# Patient Record
Sex: Female | Born: 1985 | Race: Black or African American | Hispanic: No | Marital: Single | Smoking: Current every day smoker
Health system: Southern US, Community
[De-identification: ages and names within clinical notes are randomized; demographics above are authoritative.]

## PROBLEM LIST (undated history)

## (undated) DIAGNOSIS — K219 Gastro-esophageal reflux disease without esophagitis: Secondary | ICD-10-CM

## (undated) DIAGNOSIS — K5900 Constipation, unspecified: Secondary | ICD-10-CM

## (undated) DIAGNOSIS — Z6841 Body Mass Index (BMI) 40.0 and over, adult: Secondary | ICD-10-CM

## (undated) HISTORY — DX: Constipation, unspecified: K59.00

## (undated) HISTORY — DX: Morbid (severe) obesity due to excess calories: E66.01

## (undated) HISTORY — DX: Body Mass Index (BMI) 40.0 and over, adult: Z684

## (undated) HISTORY — DX: Gastro-esophageal reflux disease without esophagitis: K21.9

---

## 2008-06-17 HISTORY — PX: ECTOPIC PREGNANCY SURGERY: SHX613

## 2008-11-12 ENCOUNTER — Emergency Department: Payer: Self-pay | Admitting: Unknown Physician Specialty

## 2008-12-13 ENCOUNTER — Emergency Department: Payer: Self-pay | Admitting: Emergency Medicine

## 2016-11-06 ENCOUNTER — Ambulatory Visit: Payer: Medicaid Other | Admitting: Physical Therapy

## 2018-02-25 ENCOUNTER — Other Ambulatory Visit: Payer: Self-pay

## 2018-02-25 ENCOUNTER — Ambulatory Visit: Payer: Medicaid Other | Attending: Internal Medicine

## 2018-02-25 DIAGNOSIS — G8929 Other chronic pain: Secondary | ICD-10-CM | POA: Diagnosis present

## 2018-02-25 DIAGNOSIS — M5441 Lumbago with sciatica, right side: Secondary | ICD-10-CM | POA: Insufficient documentation

## 2018-02-25 NOTE — Patient Instructions (Signed)
Access Code: [email or web address]  URL: [email or web address]  Date: 02/25/2018  Prepared by: Olga Coaster   Exercises  Prone Press Up - 10 reps - 2 sets - 2x daily - 7x weekly  Supine Lower Trunk Rotation - 10 reps - 1 sets - 5 hold - 2x daily - 7x weekly  Seated Piriformis Stretch with Trunk Bend - 3 reps - 1 sets - 20 hold - 2x daily - 7x weekly  Supine Piriformis Stretch with Foot on Ground - 3 reps - 1 sets - 20 hold - 2x daily - 7x weekly  Standing Lumbar Extension - 10 reps - 2 sets - 2x daily - 7x weekly

## 2018-02-25 NOTE — Therapy (Deleted)
[facility] [address], [place], [address] Phone: [phone]   Fax:  [phone]  Physical Therapy Evaluation  Patient Details  Name: Marcelyn Ruppe MRN: [id number] Date of Birth: 11-30-1985 Referring Provider: Alvina Filbert   Encounter Date: 02/25/2018  PT End of Session - 02/25/18 1301    Visit Number  1    Authorization Type  Medicaid     PT Start Time  1300    PT Stop Time  1400    PT Time Calculation (min)  60 min    Activity Tolerance  Patient tolerated treatment well    Behavior During Therapy  [facility] for tasks assessed/performed       History reviewed. No pertinent past medical history.  History reviewed. No pertinent surgical history.  There were no vitals filed for this visit.   Subjective Assessment - 02/25/18 1315    Subjective  Patient reports that she is not in any pain at start of session.     Pertinent History  Patient is 32 yo female that complains of low back pain that has been present for at least 6-7 months. It has worsened recently because it now radiates down the leg (in the last month). Patient reports that her pain increases with bending over, mobility, lifting, childcare. Patient had a two C sections for child birth (8 yrs ago, 2 yrs ago).  No other significant medical history.     Limitations  Sitting;Walking;Standing;House hold activities;Lifting    How long can you sit comfortably?  5 mins    How long can you stand comfortably?  5 mins    How long can you walk comfortably?  10 mins    Patient Stated Goals  pain relief; pain management    Currently in Pain?  No/denies    Pain Score  --   worst: 10/10 best: 0   Pain Location  Back    Pain Orientation  Mid;Lower    Pain Descriptors / Indicators  Sore;Burning;Tightness    Pain Type  Acute pain    Pain Radiating Towards  radiating to RLE (to knee)    Pain Onset  More than a month ago    Pain Frequency  Intermittent    Aggravating Factors   bending, sitting, mobility    Pain Relieving Factors  rest, hot water    Effect of Pain on Daily Activities  impedes ability to perform daily activities; worsening pain with functional tasks           Objective measurements completed on examination: See above findings.    Therapeutic exercise: Patient performed with instruction, verbal cues, tactile cues of therapist: goal: improve mobility, increase tissue extensibility  LTR with verbal/tactile cues 10x5"sec holds Piriformis stretch b/l in supine knee to chest 2x20"se ea Prone press up with cues for pacing/technique x10 Standing lumbar extension with table block x10 Seated piriformis stretch 2x20"sec b/l   Patient response to treatment: patient demonstrated improved technique with exercises with repeated demonstration and VC.     PT Education - 02/25/18 1318    Education Details  condition, PT role, expectations, therex    Person(s) Educated  Patient    Methods  Explanation;Demonstration;Handout    Comprehension  Verbalized understanding       PT Short Term Goals - 02/25/18 1520      PT SHORT TERM GOAL #1   Title  Patient will be adherent to HEP at least 3x a week to improve  functional abilities and in prep for self management of condition at home.    Time  3    Period  Weeks    Status  New    Target Date  03/18/18        PT Long Term Goals - 02/25/18 1521      PT LONG TERM GOAL #1   Title  Patient will increase BLE gross strength to 4+/5 as to improve functional strength for independent gait, increased standing tolerance and increased ADL ability.    Time  6    Period  Weeks    Status  New    Target Date  04/08/18      PT LONG TERM GOAL #2   Title  Patient will be able to perform household work/ chores without increase in symptoms.    Time  6    Period  Weeks    Status  New    Target Date  04/08/18      PT LONG TERM GOAL #3   Title  Patient will tolerate sitting unsupported  demonstrating erect sitting posture with minimal thoracic kyphosis for 15+ minutes with maximum of 5/10 back pain to demonstrate improved back extensor strength and improved sitting tolerance.     Time  6    Period  Weeks    Status  New    Target Date  04/08/18      PT LONG TERM GOAL #4   Title  Patient will demonstrate an improved ability to perform/tolerate functional tasks as exhibited by at least 10 point improvement in FOTO score.     Baseline  02/25/2018: 62    Time  6    Period  Weeks    Status  New    Target Date  04/08/18             Plan - 02/25/18 1345    Clinical Impression Statement  Patient is 32 yo female that presents with LBP that started around March 2019. States that it has worsened over time and she now gets shooting pain down her RLE to above the knee. Upon assessment patient demonstrates minimal deficits in lumbar and hip ROM, hypomobility of lumbar spine, decreased hip and core strength, impaired soft tissue integrity as well as postural abnormalities. These deficits limit the patients ability to perform functional activities such as lifting, twisting, sitting, walking, household activities, etc. The patient would benefit from physical therapy intervention to address these limitations and return patient to PLOF.    History and Personal Factors relevant to plan of care:  2 C sections, decreased physical activitiy    Clinical Presentation  Evolving    Clinical Presentation due to:  symptoms now radiating to RLE    Clinical Decision Making  Low    Rehab Potential  Good    Clinical Impairments Affecting Rehab Potential  decreased activity tolerance, impaired ROM, strength, postural abnormalities    PT Frequency  2x / week    PT Duration  6 weeks    PT Treatment/Interventions  ADLs/Self Care Home Management;Balance training;Ultrasound;Neuromuscular re-education;Aquatic Therapy;Spinal Manipulations;Joint Manipulations;Patient/family education;Gait  training;Cryotherapy;Stair training;Dry needling;Functional mobility training;Electrical Stimulation;Moist Heat;Therapeutic exercise;Therapeutic activities;Manual techniques;Taping    PT Next Visit Plan  body mechanics, STM, stretches, hip/core strengthening    PT Home Exercise Plan  LTR, piriformis, prone press up [email or web address]     Consulted and Agree with Plan of Care  Patient       Patient will benefit from skilled therapeutic intervention in order to  improve the following deficits and impairments:  Decreased endurance, Decreased mobility, Difficulty walking, Hypomobility, Increased muscle spasms, Improper body mechanics, Impaired tone, Decreased range of motion, Decreased activity tolerance, Decreased strength, Increased fascial restricitons, Impaired flexibility, Pain, Postural dysfunction  Visit Diagnosis: Chronic midline low back pain with right-sided sciatica - Plan: PT plan of care cert/re-cert     Problem List There are no active problems to display for this patient.  Olga Coaster PT, DPT 8:19 AM,02/26/18 [phone]  [facility] PH[facility] AND SPORTS MEDICINE [address] [place], [address] Phone: [phone]   Fax:  [phone]  Name: Dujuana Train MRN: [id number] Date of Birth: July 07, 1985

## 2018-02-26 NOTE — Therapy (Signed)
[facility] [address] [place], [address] Phone: [phone]   Fax:  [phone]  Physical Therapy Evaluation  Patient Details  Name: Joy Hoffman MRN: [id number] Date of Birth: 24-Feb-1986 Referring Provider: Alvina Filbert   Encounter Date: 02/25/2018  PT End of Session - 02/25/18 1301    Visit Number  1    Authorization Type  Medicaid     PT Start Time  1300    PT Stop Time  1400    PT Time Calculation (min)  60 min    Activity Tolerance  Patient tolerated treatment well    Behavior During Therapy  [facility] for tasks assessed/performed       History reviewed. No pertinent past medical history.  History reviewed. No pertinent surgical history.  There were no vitals filed for this visit.   Subjective Assessment - 02/25/18 1315    Subjective  Patient reports that she is not in any pain at start of session.     Pertinent History  Patient is 32 yo female that complains of low back pain that has been present for at least 6-7 months. It has worsened recently because it now radiates down the leg (in the last month). Patient reports that her pain increases with bending over, mobility, lifting, childcare. Patient had a two C sections for child birth (8 yrs ago, 2 yrs ago).  No other significant medical history.     Limitations  Sitting;Walking;Standing;House hold activities;Lifting    How long can you sit comfortably?  5 mins    How long can you stand comfortably?  5 mins    How long can you walk comfortably?  10 mins    Patient Stated Goals  pain relief; pain management    Currently in Pain?  No/denies    Pain Score  --   worst: 10/10 best: 0   Pain Location  Back    Pain Orientation  Mid;Lower    Pain Descriptors / Indicators  Sore;Burning;Tightness    Pain Type  Acute pain    Pain Radiating Towards  radiating to RLE (to knee)    Pain Onset  More than a month ago    Pain Frequency  Intermittent     Aggravating Factors   bending, sitting, mobility    Pain Relieving Factors  rest, hot water    Effect of Pain on Daily Activities  impedes ability to perform daily activities; worsening pain with functional tasks        02/25/18 0001  Assessment  Medical Diagnosis Radiculopathy  Referring Provider Alvina Filbert  Onset Date/Surgical Date 08/15/17  Hand Dominance Left  Next MD Visit 03/09/18  Prior Therapy no  Precautions  Precautions None  Restrictions  Weight Bearing Restrictions No  Balance Screen  Has the patient fallen in the past 6 months No  Has the patient had a decrease in activity level because of a fear of falling?  No  Is the patient reluctant to leave their home because of a fear of falling?  No  Home Environment  Living Environment Private residence  Living Arrangements Children;Spouse/significant other  Available Help at Discharge Family  Type of Home Apartment  Home Access Stairs to enter  Entrance Stairs-Number of Steps 2  Entrance Stairs-Rails None  Home Layout One level  Prior Function  Level of Independence Independent  Vocation Part time employment  Vocation Requirements [employer] (standing; awkward positions, lifting, reaching)  Cognition  Overall Cognitive  Status Within Functional Limits for tasks assessed  Posture/Postural Control  Posture Comments fwd head/rounded shoulder/slouched in sitting  ROM / Strength  AROM / PROM / Strength AROM;Strength  AROM  Overall AROM  Deficits  AROM Assessment Site Lumbar;Hip  Right/Left Hip Right;Left  Lumbar Flexion 100 (painful)  Lumbar Extension 100 (hinged in lumbar spine)  Lumbar - Right Side Bend 100  Lumbar - Left Side Bend 100  Lumbar - Right Rotation 100  Lumbar - Left Rotation 100  Right Hip Flexion 90  Right Hip Internal Rotation  20  Left Hip Flexion 110  Left Hip External Rotation  30  Left Hip Internal Rotation  30  Strength  Overall Strength Deficits  Overall Strength Comments  decreased core strength  Strength Assessment Site Hip  Right/Left Hip Right;Left  Right Hip Flexion 4+/5  Right Hip Extension 4-/5  Right Hip ABduction 3+/5  Left Hip Flexion 4+/5  Left Hip Extension 4-/5  Left Hip ABduction 3+/5  Flexibility  Soft Tissue Assessment /Muscle Length y  Hamstrings mild b/l  Quadriceps mild b/l  Piriformis mod R mild L  Palpation  Palpation comment tender to lumbar spine/paraspinals, hypertonic hip muscles/paraspinals  Special Tests  Other special tests  (FADDIr, FABER, SLR, slump, scour -)    Objective measurements completed on examination: See above findings.   Therapeutic exercise: Patient performed with instruction, verbal cues, tactile cues of therapist: goal: improve mobility, increase tissue extensibility  LTR with verbal/tactile cues 10x5"sec holds Piriformis stretch b/l in supine knee to chest 2x20"se ea Prone press up with cues for pacing/technique x10 Standing lumbar extension with table block x10 Seated piriformis stretch 2x20"sec b/l   Patient response to treatment: patient demonstrated improved technique with exercises with repeated demonstration and VC.    PT Education - 02/25/18 1318    Education Details  condition, PT role, expectations, therex    Person(s) Educated  Patient    Methods  Explanation;Demonstration;Handout    Comprehension  Verbalized understanding       PT Short Term Goals - 02/25/18 1520      PT SHORT TERM GOAL #1   Title  Patient will be adherent to HEP at least 3x a week to improve functional abilities and in prep for self management of condition at home.    Baseline  Patient needs further instruction, cues, and encouragement to perform HEP properly.    Time  3    Period  Weeks    Status  New    Target Date  03/18/18        PT Long Term Goals - 02/25/18 1521      PT LONG TERM GOAL #1   Title  Patient will increase BLE gross strength to 4+/5 as to improve functional strength for independent  gait, increased standing tolerance and increased ADL ability.    Baseline  See objective measures section for current MMT strength of LE.    Time  6    Period  Weeks    Status  New    Target Date  04/08/18      PT LONG TERM GOAL #2   Title  Patient will be able to perform household work/ chores without increase in symptoms.    Baseline  Patient with pain 0-10 with activities.    Time  6    Period  Weeks    Status  New    Target Date  04/08/18      PT LONG TERM GOAL #3  Title  Patient will tolerate sitting unsupported demonstrating erect sitting posture with minimal thoracic kyphosis for 15+ minutes with maximum of 5/10 back pain to demonstrate improved back extensor strength and improved sitting tolerance.     Baseline  Patient only able to tolerate erect sitting ~675mins before pain becomes intolerable    Time  6    Period  Weeks    Status  New    Target Date  04/08/18      PT LONG TERM GOAL #4   Title  Patient will demonstrate an improved ability to perform/tolerate functional tasks as exhibited by at least 10 point improvement in FOTO score.     Baseline  02/25/2018: 62    Time  6    Period  Weeks    Status  New    Target Date  04/08/18             Plan - 02/25/18 1345    Clinical Impression Statement  Patient is 32 yo female that presents with LBP that started around March 2019. States that it has worsened over time and she now gets shooting pain down her RLE to above the knee. Upon assessment patient demonstrates minimal deficits in lumbar and hip ROM, hypomobility of lumbar spine, decreased hip and core strength, impaired soft tissue integrity as well as postural abnormalities. These deficits limit the patients ability to perform functional activities such as lifting, twisting, sitting, walking, household activities, etc. The patient would benefit from physical therapy intervention to address these limitations and return patient to PLOF.    History and Personal Factors  relevant to plan of care:  2 C sections, decreased physical activitiy    Clinical Presentation  Evolving    Clinical Presentation due to:  symptoms now radiating to RLE    Clinical Decision Making  Low    Rehab Potential  Good    Clinical Impairments Affecting Rehab Potential  decreased activity tolerance, impaired ROM, strength, postural abnormalities    PT Frequency  2x / week    PT Duration  6 weeks    PT Treatment/Interventions  ADLs/Self Care Home Management;Balance training;Ultrasound;Neuromuscular re-education;Aquatic Therapy;Spinal Manipulations;Joint Manipulations;Patient/family education;Gait training;Cryotherapy;Stair training;Dry needling;Functional mobility training;Electrical Stimulation;Moist Heat;Therapeutic exercise;Therapeutic activities;Manual techniques;Taping    PT Next Visit Plan  body mechanics, STM, stretches, hip/core strengthening    PT Home Exercise Plan  LTR, piriformis, prone press up [email or web address]     Consulted and Agree with Plan of Care  Patient       Patient will benefit from skilled therapeutic intervention in order to improve the following deficits and impairments:  Decreased endurance, Decreased mobility, Difficulty walking, Hypomobility, Increased muscle spasms, Improper body mechanics, Impaired tone, Decreased range of motion, Decreased activity tolerance, Decreased strength, Increased fascial restricitons, Impaired flexibility, Pain, Postural dysfunction  Visit Diagnosis: Chronic midline low back pain with right-sided sciatica - Plan: PT plan of care cert/re-cert     Problem List There are no active problems to display for this patient.  Olga CoasterDiana Alivea Gladson PT, DPT 9:40 AM,02/26/18 [phone]  [facility] [address] [place], [place], [address] Phone: [phone]   Fax:  [phone]  Name: Joy Hoffman MRN: [id number] Date of Birth: Jul 29, 1985

## 2018-03-04 ENCOUNTER — Ambulatory Visit: Payer: Medicaid Other

## 2018-03-05 ENCOUNTER — Ambulatory Visit: Payer: Medicaid Other

## 2018-03-11 ENCOUNTER — Ambulatory Visit: Payer: Medicaid Other

## 2018-03-11 DIAGNOSIS — G8929 Other chronic pain: Secondary | ICD-10-CM

## 2018-03-11 DIAGNOSIS — M5441 Lumbago with sciatica, right side: Principal | ICD-10-CM

## 2018-03-11 NOTE — Therapy (Signed)
[facility] [address] [place], [address] Phone: [phone]   Fax:  [phone]  Physical Therapy Treatment  Patient Details  Name: Joy Hoffman MRN: [id number] Date of Birth: [birth year] Referring Provider: Alvina Filbert   Encounter Date: 03/11/2018  PT End of Session - 03/11/18 1733    Visit Number  2    Authorization Type  Medicaid     Authorization Time Period  1/3    PT Start Time  1731    PT Stop Time  1815    PT Time Calculation (min)  44 min    Activity Tolerance  Patient tolerated treatment well    Behavior During Therapy  [facility] for tasks assessed/performed       History reviewed. No pertinent past medical history.  History reviewed. No pertinent surgical history.  There were no vitals filed for this visit.  Subjective Assessment - 03/11/18 1732    Subjective  Patient reports that she still has some burning/tightness in midline of low back. Patient reports that she was standing all day.     Currently in Pain?  Yes    Pain Score  2     Pain Location  Back    Pain Onset  More than a month ago       Objective: hypomobility of lumbar spine assessed with CPA mobilizations  Therapeutic exercise:Patient performed with instruction, verbal cues, tactile cues of therapist: goal: improve mobility, strengthen LE, increase tissue extensibility  LTR with verbal/tactile cues 10x5"sec holds Piriformis stretch b/l in supine knee to chest 2x20"se ea Prone press up with cues for pacing/technique x10  Seated piriformis stretch 3x30"sec b/l  Prone hip extension with verbal cues x10 Clamshells in s/l with tactile/verbal cues x15 b/l Seated HS stretch b/l 3x30s Mini crunches 2x10 Oblique work 2x10 (tapping each ankle in supine by performing thoracic lat flexion) TrA activation in supine with verbal/visual cues 10x3s holds   Manual therapy: x55mins CPA to lumbar spine 30s bouts x2 bouts per  level IASTM to b/l lumbar paraspinals for tissue tension  Patient response to treatment: patient demonstrated improved technique with exercises with repeated demonstration and VC.     PT Education - 03/11/18 1733    Education Details  therex technique    Person(s) Educated  Patient    Methods  Explanation;Tactile cues;Verbal cues    Comprehension  Verbalized understanding;Returned demonstration       PT Short Term Goals - 02/25/18 1520      PT SHORT TERM GOAL #1   Title  Patient will be adherent to HEP at least 3x a week to improve functional abilities and in prep for self management of condition at home.    Baseline  Patient needs further instruction, cues, and encouragement to perform HEP properly.    Time  3    Period  Weeks    Status  New    Target Date  03/18/18        PT Long Term Goals - 02/25/18 1521      PT LONG TERM GOAL #1   Title  Patient will increase BLE gross strength to 4+/5 as to improve functional strength for independent gait, increased standing tolerance and increased ADL ability.    Baseline  See objective measures section for current MMT strength of LE.    Time  6    Period  Weeks    Status  New    Target Date  04/08/18  PT LONG TERM GOAL #2   Title  Patient will be able to perform household work/ chores without increase in symptoms.    Baseline  Patient with pain 0-10 with activities.    Time  6    Period  Weeks    Status  New    Target Date  04/08/18      PT LONG TERM GOAL #3   Title  Patient will tolerate sitting unsupported demonstrating erect sitting posture with minimal thoracic kyphosis for 15+ minutes with maximum of 5/10 back pain to demonstrate improved back extensor strength and improved sitting tolerance.     Baseline  Patient only able to tolerate erect sitting ~27mins before pain becomes intolerable    Time  6    Period  Weeks    Status  New    Target Date  04/08/18      PT LONG TERM GOAL #4   Title  Patient will  demonstrate an improved ability to perform/tolerate functional tasks as exhibited by at least 10 point improvement in FOTO score.     Baseline  02/25/2018: 62    Time  6    Period  Weeks    Status  New    Target Date  04/08/18            Plan - 03/11/18 1732    Clinical Impression Statement  Patient demonstrated good tolerance to activity today. Reported improvement in symptoms with lumbar spine CPAs grade III/IV, improved spinal mobility with repeated mobilizations.    PT Treatment/Interventions  ADLs/Self Care Home Management;Balance training;Ultrasound;Neuromuscular re-education;Aquatic Therapy;Spinal Manipulations;Joint Manipulations;Patient/family education;Gait training;Cryotherapy;Stair training;Dry needling;Functional mobility training;Electrical Stimulation;Moist Heat;Therapeutic exercise;Therapeutic activities;Manual techniques;Taping    PT Next Visit Plan  body mechanics, STM, stretches, hip/core strengthening    PT Home Exercise Plan  LTR, piriformis, prone press up [email or web address]        Patient will benefit from skilled therapeutic intervention in order to improve the following deficits and impairments:  Decreased endurance, Decreased mobility, Difficulty walking, Hypomobility, Increased muscle spasms, Improper body mechanics, Impaired tone, Decreased range of motion, Decreased activity tolerance, Decreased strength, Increased fascial restricitons, Impaired flexibility, Pain, Postural dysfunction  Visit Diagnosis: Chronic midline low back pain with right-sided sciatica     Problem List There are no active problems to display for this patient.  Olga Coaster PT, DPT 6:16 PM,03/11/18 [phone]   [facility] PHYSICAL AND SPORTS MEDICINE [address] [place], [address] Phone: [phone]   Fax:  [phone]  Name: Brinlynn Gorton MRN: [id number] Date of Birth: Feb 27, 1986

## 2018-03-24 ENCOUNTER — Ambulatory Visit: Payer: Medicaid Other | Attending: Internal Medicine

## 2018-03-24 DIAGNOSIS — G8929 Other chronic pain: Secondary | ICD-10-CM | POA: Insufficient documentation

## 2018-03-24 DIAGNOSIS — M5441 Lumbago with sciatica, right side: Secondary | ICD-10-CM | POA: Insufficient documentation

## 2018-03-24 NOTE — Therapy (Signed)
[facility]INE [address], [place], [address] Phone: [phone]   Fax:  [phone]  Physical Therapy Treatment  Patient Details  Name: Joy Hoffman MRN: [id number] Date of Birth: 12/13/1985 Referring Provider (PT): Alvina Filbert   Encounter Date: 03/24/2018  PT End of Session - 03/24/18 1613    Visit Number  3    Authorization Type  Medicaid     Authorization Time Period  2/3    PT Start Time  1614    PT Stop Time  1707    PT Time Calculation (min)  53 min    Activity Tolerance  Patient tolerated treatment well    Behavior During Therapy  [facility] for tasks assessed/performed       History reviewed. No pertinent past medical history.  History reviewed. No pertinent surgical history.  There were no vitals filed for this visit.  Subjective Assessment - 03/24/18 1618    Subjective  Patient reports a little bit of pain at start of session, 3/10. Patient reports that it took her about 45 minutes to get here and her pain increased with the drive. Patient reports a sharp, cramping pain that lasted about 10 mins, states it has not happened since.     Pertinent History  Patient is 32 yo female that complains of low back pain that has been present for at least 6-7 months. It has worsened recently because it now radiates down the leg (in the last month). Patient reports that her pain increases with bending over, mobility, lifting, childcare. Patient had a two C sections for child birth (8 yrs ago, 2 yrs ago).  No other significant medical history.     How long can you sit comfortably?  3 hours    How long can you stand comfortably?  1-2hrs    How long can you walk comfortably?  3-4 hrs     Patient Stated Goals  pain relief; pain management    Currently in Pain?  Yes    Pain Score  3     Pain Location  Back    Pain Orientation  Mid    Pain Descriptors / Indicators  Sore    Pain Type  Acute pain    Pain Radiating  Towards  no longer radiating    Pain Onset  More than a month ago    Aggravating Factors   bending for prolonged periods    Pain Relieving Factors  movement, exercises, hot water         [facility] PT Assessment - 03/24/18 0001      AROM   Lumbar Flexion  100    Lumbar Extension  100    Lumbar - Right Side Bend  100    Lumbar - Left Side Bend  100    Lumbar - Right Rotation  100    Lumbar - Left Rotation  100      Strength   Right Hip Flexion  4+/5    Right Hip Extension  4/5    Right Hip ABduction  4/5    Left Hip Flexion  4+/5    Left Hip Extension  4/5    Left Hip ABduction  3+/5      Flexibility   Hamstrings  mild b/l    Piriformis  mild b/l      Palpation   Palpation comment  not TTP          See objective measures above.  Therapeutic exercise:Patient performed with instruction, verbal cues, tactile cues of therapist: goal: improve mobility, strengthen LE, increase tissue extensibility  LTR with verbal/tactile cues 10x5"sec holds Piriformis stretch b/l in supine knee to chest 1x30"se ea Prone press up with cues for pacing/technique x10 Seated piriformis stretch 3x30"sec b/l  Seated HS stretch x30s ea, verbal/demonstration needed Prone hip extension with verbal cues x10 Clamshells in s/l with tactile/verbal cues x15 b/l Bridges x15 with transverse abdominis activation and verbal cues Seated HS stretch b/l 3x30s Mini crunches 2x10 TrA activation in supine with verbal/visual cues 10x3s holds Sit to stands with proper form and TrA activation. Verbal and tactile cues needed, as well as demonstrated    Patient response to treatment:patient demonstrated improved technique with exercises with repetitions and VC.     PT Education - 03/24/18 1622    Education Details  therex technique    Person(s) Educated  Patient    Methods  Explanation;Demonstration;Tactile cues    Comprehension  Verbalized understanding;Returned demonstration       PT Short Term  Goals - 03/24/18 1622      PT SHORT TERM GOAL #1   Title  Patient will be adherent to HEP at least 3x a week to improve functional abilities and in prep for self management of condition at home.    Baseline  Patient reports consistently performing her exercises    Status  Achieved    Target Date  03/24/18        PT Long Term Goals - 03/24/18 1622      PT LONG TERM GOAL #1   Title  Patient will increase BLE gross strength to 4+/5 as to improve functional strength for independent gait, increased standing tolerance and increased ADL ability.    Baseline  See objective measures section for current MMT strength of LE. Still presents with b/l hip weakness.    Time  4    Period  Weeks    Status  On-going    Target Date  04/21/18      PT LONG TERM GOAL #2   Title  Patient will be able to perform household work/ chores without increase in symptoms.    Baseline  patient reports pain with washing dishes/bathing her son, still about a 10/10    Time  4    Period  Weeks    Status  On-going    Target Date  04/21/18      PT LONG TERM GOAL #3   Title  Patient will tolerate sitting unsupported demonstrating erect sitting posture with minimal thoracic kyphosis for 15+ minutes with maximum of 5/10 back pain to demonstrate improved back extensor strength and improved sitting tolerance.     Status  Achieved    Target Date  04/08/18      PT LONG TERM GOAL #4   Title  Patient will demonstrate an improved ability to perform/tolerate functional tasks as exhibited by at least 10 point improvement in FOTO score.     Baseline  02/25/2018: 62 03/24/2018: 62    Time  4    Period  Weeks    Target Date  04/21/18      PT LONG TERM GOAL #5   Title  Patient will compliant with HEP and show progression of exercises to indicate patients ability to self manage condition/symptoms.     Time  4    Period  Weeks    Status  New    Target Date  04/21/18  Plan - 03/24/18 1624    Clinical Impression  Statement  Patient reports that she thinks therapy has been helpful for her pain, states her exercises help manage her pain. Overall patient reports she only has pain when she is bending forward, for example washing the dishes/bathing her son.  Overall patient demonstrated improved ROM, decreased pain, and improved LE strength. Patient still has complaints of pain with some functional activities. At this time the patient would benefit from 1 PT follow up visit 1 month to ensure HEP compliance, to progress as indicated and to reassess pain. Patient agreeable to POC, and HEP administered and updated.      PT Frequency  Monthy    PT Duration  4 weeks    PT Treatment/Interventions  ADLs/Self Care Home Management;Balance training;Ultrasound;Neuromuscular re-education;Aquatic Therapy;Spinal Manipulations;Joint Manipulations;Patient/family education;Gait training;Cryotherapy;Stair training;Dry needling;Functional mobility training;Electrical Stimulation;Moist Heat;Therapeutic exercise;Therapeutic activities;Manual techniques;Taping    PT Next Visit Plan  body mechanics, STM, stretches, hip/core strengthening    PT Home Exercise Plan  LTR, piriformis, prone press up [email or web address]     Consulted and Agree with Plan of Care  Patient       Patient will benefit from skilled therapeutic intervention in order to improve the following deficits and impairments:  Pain, Postural dysfunction, Decreased endurance, Decreased strength  Visit Diagnosis: Chronic midline low back pain with right-sided sciatica     Problem List There are no active problems to display for this patient.   Olga Coaster PT, DPT 5:14 PM,03/24/18 [phone]  [facility] PHYSICAL AND SPORTS MEDICINE [address], [place], [address] Phone: [phone]   Fax:  [phone]  Name: Harshini Trent MRN: [id number] Date of Birth: [birth year]

## 2018-03-24 NOTE — Patient Instructions (Signed)
Access Code: [email or web address]  URL: [email or web address]  Date: 03/24/2018  Prepared by: Olga Coaster   Exercises  Prone Press Up - 10 reps - 2 sets - 2x daily - 7x weekly  Standing Lumbar Extension - 10 reps - 2 sets - 2x daily - 7x weekly  Supine Lower Trunk Rotation - 10 reps - 1 sets - 5 hold - 2x daily - 7x weekly  Seated Hamstring Stretch with Chair - 3 reps - 1 sets - 30 hold - 1x daily - 7x weekly  Seated Piriformis Stretch with Trunk Bend - 3 reps - 1 sets - 30 hold - 2x daily - 7x weekly  Supine Piriformis Stretch with Foot on Ground - 3 reps - 1 sets - 30 hold - 2x daily - 7x weekly  Prone Hip Extension - 10 reps - 2 sets - 1x daily - 3x weekly  Clamshell with Resistance - 10 reps - 3 sets - 1x daily - 3x weekly  Hooklying Transversus Abdominis Palpation - 10 reps - 2 sets - 5 hold - 1x daily - 3x weekly  Sit to Stand without Arm Support - 10 reps - 3 sets - 1x daily - 3x weekly  Bridge - 10 reps - 2 sets - 1x daily - 7x weekly

## 2018-03-30 ENCOUNTER — Ambulatory Visit: Payer: Medicaid Other

## 2018-04-01 ENCOUNTER — Ambulatory Visit: Payer: Medicaid Other

## 2018-04-06 ENCOUNTER — Ambulatory Visit: Payer: Medicaid Other

## 2018-04-21 ENCOUNTER — Encounter: Payer: Self-pay | Admitting: Physical Therapy

## 2018-04-21 ENCOUNTER — Ambulatory Visit: Payer: Medicaid Other | Attending: Internal Medicine | Admitting: Physical Therapy

## 2018-04-21 DIAGNOSIS — M5441 Lumbago with sciatica, right side: Secondary | ICD-10-CM | POA: Insufficient documentation

## 2018-04-21 DIAGNOSIS — G8929 Other chronic pain: Secondary | ICD-10-CM

## 2018-04-21 NOTE — Therapy (Signed)
[facility] PHYS[facility]ORTS MEDICINE [address], [place], [address] Phone: [phone]   Fax:  [phone]  Physical Therapy Treatment and Discharge Summary Reporting period: 02/25/2018 through 04/21/2018  Patient Details  Name: Joy Hoffman MRN: [id number] Date of Birth: 05/09/1986 Referring Provider (PT): Alvina Filbert   Encounter Date: 04/21/2018  PT End of Session - 04/21/18 1623    Visit Number  4    Authorization Type  Medicaid     Authorization Time Period  1/12    PT Start Time  1615    PT Stop Time  1645    PT Time Calculation (min)  30 min    Activity Tolerance  Patient tolerated treatment well    Behavior During Therapy  [facility] for tasks assessed/performed       History reviewed. No pertinent past medical history.  History reviewed. No pertinent surgical history.  There were no vitals filed for this visit.  Subjective Assessment - 04/21/18 1618    Subjective  Patient reports she has been feeling well for the last month and has been able to independently abolish her pain with the home exercises she was given. She states she has no back pain today. She feels confident she can continue to maintain her progress going forward for her low back pain. She does have some right sided neck pain today with onset about 2 weeks ago. She woke up with a cramping feeling in the right neck. She put a cold pack and heat on it that helped but it continues to be sore. She wants to know if she can come to PT for her neck.     Pertinent History  Patient is 32 yo female that complains of low back pain that has been present for at least 6-7 months. It has worsened recently because it now radiates down the leg (in the last month). Patient reports that her pain increases with bending over, mobility, lifting, childcare. Patient had a two C sections for child birth (8 yrs ago, 2 yrs ago).  No other significant medical history.     How long can you sit  comfortably?  not limiting    How long can you stand comfortably?  not limiting    How long can you walk comfortably?  unlimited for usual activities    Patient Stated Goals  pain relief; pain management (acheived 04/21/2018)    Currently in Pain?  No/denies    Pain Onset  More than a month ago    Effect of Pain on Daily Activities  none from low back pain         [facility] PT Assessment - 04/21/18 0001      AROM   Lumbar Flexion  100    Lumbar Extension  100    Lumbar - Right Side Bend  100    Lumbar - Left Side Bend  100    Lumbar - Right Rotation  100    Lumbar - Left Rotation  100      Strength   Right Hip Flexion  4+/5    Right Hip Extension  4+/5    Right Hip ABduction  4+/5    Left Hip Flexion  4+/5    Left Hip Extension  4+/5    Left Hip ABduction  4+/5      Palpation   Palpation comment  not TTP         PT Education - 04/21/18 1622  Education Details  educated in simple techniques to continue managing pain over time    Person(s) Educated  Patient    Methods  Explanation;Demonstration    Comprehension  Verbalized understanding;Returned demonstration       See objective measures above.  FOTO = 94  Therapeutic exercise:Patient performed with instruction, verbal cues, tactile cues of therapist: goal: improve mobility,strengthen LE,increase tissue extensibility - Examination of lumbar spine to assess readiness for discharge (see above) - trial of correction of seated posture with lumbar roll and how to use during daily activities to reduce chance of low back pain returning.  -Education on diagnosis, prognosis, POC, anatomy and physiology of current condition.  - review of long term HEP   Also performed quick screen of cervical spine with advice for self management (able to abolish pain with 10 reps of cervical retraction with self overpressure) - not billed   Patient response to treatment:patient tolerated session well. She  demonstrated no pain or  functional limitations and reported being pain free at end of session.   PT Short Term Goals - 04/21/18 1623      PT SHORT TERM GOAL #1   Title  Patient will be adherent to HEP at least 3x a week to improve functional abilities and in prep for self management of condition at home.    Baseline  Patient reports consistently performing her exercises    Time  3    Period  Weeks    Status  Achieved    Target Date  03/24/18        PT Long Term Goals - 04/21/18 1624      PT LONG TERM GOAL #1   Title  Patient will increase BLE gross strength to 4+/5 as to improve functional strength for independent gait, increased standing tolerance and increased ADL ability.    Baseline  See objective measures section for current MMT strength of LE.     Time  4    Period  Weeks    Status  Achieved    Target Date  04/21/18      PT LONG TERM GOAL #2   Title  Patient will be able to perform household work/ chores without increase in symptoms.    Baseline  no difficulty 04/21/2018    Time  4    Period  Weeks    Status  Achieved      PT LONG TERM GOAL #3   Title  Patient will tolerate sitting unsupported demonstrating erect sitting posture with minimal thoracic kyphosis for 15+ minutes with maximum of 5/10 back pain to demonstrate improved back extensor strength and improved sitting tolerance.     Time  6    Period  Weeks    Status  Achieved    Target Date  04/08/18      PT LONG TERM GOAL #4   Title  Patient will demonstrate an improved ability to perform/tolerate functional tasks as exhibited by at least 10 point improvement in FOTO score.     Baseline  02/25/2018: 62 03/24/2018: 62; 04/21/2018: 94    Time  4    Period  Weeks    Status  Achieved    Target Date  04/21/18      PT LONG TERM GOAL #5   Title  Patient will compliant with HEP and show progression of exercises to indicate patients ability to self manage condition/symptoms.     Time  4    Period  Weeks  Status  Achieved    Target Date   04/21/18          Plan - 04/21/18 1655    Clinical Impression Statement  Patient has attended 4 physical therapy treatment sessions resulting in full resolution of symptoms and meeting all goals. Over the course of her treatment, pt has progressed steadily towards established goals. She is now discharged due to meeting all goals and no longer requiring skilled physical therapy. She is to continue practicing HEP for prophylaxis and independent management of her condition.      PT Treatment/Interventions  ADLs/Self Care Home Management;Balance training;Neuromuscular re-education;Spinal Manipulations;Joint Manipulations;Patient/family education;Functional mobility training;Therapeutic exercise;Therapeutic activities;Manual techniques    PT Next Visit Plan  Patient is now discharged from physical therapy due to reaching goals and acheiving recovery of function.     PT Home Exercise Plan  LTR, piriformis, prone press up [email or web address]     Consulted and Agree with Plan of Care  Patient       Patient will benefit from skilled therapeutic intervention in order to improve the following deficits and impairments:  Pain, Postural dysfunction, Decreased endurance, Decreased strength  Visit Diagnosis: Chronic midline low back pain with right-sided sciatica     Problem List There are no active problems to display for this patient.   Cira Rue, PT, DPT 04/21/2018, 4:57 PM  [facility] PHYSICAL AND SPORTS MEDICINE [address], [place], [address] Phone: [phone]   Fax:  [phone]  Name: Joy Hoffman MRN: [id number] Date of Birth: March 28, 1986

## 2018-04-21 NOTE — Patient Instructions (Signed)
   Medbridge Access Code: [email or web address]  URL: [email or web address]  Date: 04/21/2018  Prepared by: Norton Blizzard   Exercises  Seated Posture with Lumbar Roll  Seated Cervical Retraction - 10-15 reps - 1 second hold - 4-6x daily  Cervical Retraction with Overpressure - 10-15 reps - 1 second hold - 4-6x daily  Seated Cervical Retraction and Extension - 10-15 reps - 1 second hold - 4-6x daily

## 2021-06-28 ENCOUNTER — Encounter: Payer: Self-pay | Admitting: Internal Medicine

## 2021-08-08 ENCOUNTER — Encounter: Payer: Self-pay | Admitting: Internal Medicine

## 2021-08-08 ENCOUNTER — Other Ambulatory Visit: Payer: Self-pay

## 2021-08-08 ENCOUNTER — Ambulatory Visit (INDEPENDENT_AMBULATORY_CARE_PROVIDER_SITE_OTHER): Payer: Medicaid Other | Admitting: Internal Medicine

## 2021-08-08 VITALS — BP 131/77 | HR 71 | Temp 97.3°F | Ht 64.0 in | Wt 269.0 lb

## 2021-08-08 DIAGNOSIS — K219 Gastro-esophageal reflux disease without esophagitis: Secondary | ICD-10-CM | POA: Diagnosis not present

## 2021-08-08 DIAGNOSIS — K625 Hemorrhage of anus and rectum: Secondary | ICD-10-CM | POA: Diagnosis not present

## 2021-08-08 DIAGNOSIS — K5900 Constipation, unspecified: Secondary | ICD-10-CM | POA: Diagnosis not present

## 2021-08-08 NOTE — Progress Notes (Signed)
Primary Care Physician:  Alvina Filbert, MD Primary Gastroenterologist:  Dr. Marletta Lor  Chief Complaint  Patient presents with   Rectal Bleeding    Light colored. Happened last month. No bleeding now   Constipation    Has to strain sometimes    HPI:   Joy Hoffman is a 36 y.o. female who presents to clinic today by referral from her PCP Dr. Durene Cal for evaluation.  She states 1 month ago she had singular episode of bright red blood per rectum.  States it occurred during bowel movement.  Notes bright red blood on the tissue paper.  Unsure if she had any blood in the toilet bowl itself.  No rectal pain or discomfort.  Does note chronic constipation.  Moderate in severity.  States bowel movements every 2 to 4 days.  Also notes associated straining.  Does not take any medication for this.  Also with mild intermittent acid reflux.  States anytime she eats spicy food she will have acid reflux.  No dysphagia odynophagia.  No epigastric or chest pain.  No family history of colorectal malignancy.  No melena.  Past Medical History:  Diagnosis Date   Constipation    Morbid obesity with BMI of 45.0-49.9, adult [facility])     Past Surgical History:  Procedure Laterality Date   CESAREAN SECTION      Current Outpatient Medications  Medication Sig Dispense Refill   Cyanocobalamin (VITAMIN B12 PO) Take by mouth daily.     etonogestrel (NEXPLANON) 68 MG IMPL implant 1 each by Subdermal route once.     Probiotic Product (PROBIOTIC PO) Take by mouth daily.     No current facility-administered medications for this visit.    Allergies as of 08/08/2021   (No Known Allergies)    Family History  Problem Relation Age of Onset   Heart disease Mother    Heart disease Father     Social History   Socioeconomic History   Marital status: Single    Spouse name: Not on file   Number of children: Not on file   Years of education: Not on file   Highest education level: Not on file  Occupational History    Not on file  Tobacco Use   Smoking status: Every Day    Packs/day: 1.00    Types: Cigarettes    Start date: 06/27/2005   Smokeless tobacco: Never  Substance and Sexual Activity   Alcohol use: Not Currently   Drug use: Not Currently   Sexual activity: Not on file  Other Topics Concern   Not on file  Social History Narrative   Not on file   Social Determinants of Health   Financial Resource Strain: Not on file  Food Insecurity: Not on file  Transportation Needs: Not on file  Physical Activity: Not on file  Stress: Not on file  Social Connections: Not on file  Intimate Partner Violence: Not on file    Subjective: Review of Systems  Constitutional:  Negative for chills and fever.  HENT:  Negative for congestion and hearing loss.   Eyes:  Negative for blurred vision and double vision.  Respiratory:  Negative for cough and shortness of breath.   Cardiovascular:  Negative for chest pain and palpitations.  Gastrointestinal:  Negative for abdominal pain, blood in stool, constipation, diarrhea, heartburn, melena and vomiting.  Genitourinary:  Negative for dysuria and urgency.  Musculoskeletal:  Negative for joint pain and myalgias.  Skin:  Negative for itching and rash.  Neurological:  Negative for dizziness and headaches.  Psychiatric/Behavioral:  Negative for depression. The patient is not nervous/anxious.       Objective: BP 131/77    Pulse 71    Temp (!) 97.3 F (36.3 C) (Temporal)    Ht 5\' 4"  (1.626 m)    Wt 269 lb (122 kg)    BMI 46.17 kg/m  Physical Exam Constitutional:      Appearance: Normal appearance. She is obese.  HENT:     Head: Normocephalic and atraumatic.  Eyes:     Extraocular Movements: Extraocular movements intact.     Conjunctiva/sclera: Conjunctivae normal.  Cardiovascular:     Rate and Rhythm: Normal rate and regular rhythm.  Pulmonary:     Effort: Pulmonary effort is normal.     Breath sounds: Normal breath sounds.  Abdominal:     General:  Bowel sounds are normal.     Palpations: Abdomen is soft.  Musculoskeletal:        General: No swelling. Normal range of motion.     Cervical back: Normal range of motion and neck supple.  Skin:    General: Skin is warm and dry.     Coloration: Skin is not jaundiced.  Neurological:     General: No focal deficit present.     Mental Status: She is alert and oriented to person, place, and time.  Psychiatric:        Mood and Affect: Mood normal.        Behavior: Behavior normal.     Assessment: *Rectal bleeding-1 episode, mild *Constipation *GERD-mild, intermittent  Plan: Patient with singular episode of bright red blood on tissue paper with bowel movement 1 month ago.  Likely due to hemorrhoids in the setting of chronic constipation.    If she has further episodes, will consider colonoscopy to further evaluate.  For now we will treat her constipation. I recommended taking MiraLAX 1 capful daily.  If this is not adequate then increase to 2 capfuls daily.  If this is still not adequate then add on once daily Dulcolax.  Also recommended patient start taking fiber therapy.  Print out given to patient today.  Encouraged to drink at least 6 glasses of water daily.  If not adequately improved, can consider Linzess.  GERD mild, intermittent, no alarm symptoms today.  Recommend she take over-the-counter famotidine as needed.  Follow-up in 4 months or sooner if needed.  Thank you Dr. for the kind referral.   08/08/2021 10:38 AM   Disclaimer: This note was dictated with voice recognition software. Similar sounding words can inadvertently be transcribed and may not be corrected upon review.

## 2021-08-08 NOTE — Patient Instructions (Signed)
For your intermittent acid reflux, would recommend taking over-the-counter famotidine (Pepcid) as needed.  For your constipation, I want you to start taking over the counter MiraLAX 1 capful daily.  If this does not adequately control your constipation, I would increase to 2 capfuls daily.  If this is still not adequate, then I would add on once daily Dulcolax (bisacodyl) tablet.   I also recommend increasing fiber in your diet or adding OTC Benefiber/Metamucil. Be sure to drink at least 4 to 6 glasses of water daily.   Continue to monitor for rectal bleeding.  If you have further episodes, we will pursue colonoscopy to further evaluate.  Otherwise follow-up in 3 to 4 months.  It was very nice meeting you today.  Dr. Marletta Lor  At [facility] we value your feedback. You may receive a survey about your visit today. Please share your experience as we strive to create trusting relationships with our patients to provide genuine, compassionate, quality care.  We appreciate your understanding and patience as we review any laboratory studies, imaging, and other diagnostic tests that are ordered as we care for you. Our office policy is 5 business days for review of these results, and any emergent or urgent results are addressed in a timely manner for your best interest. If you do not hear from our office in 1 week, please contact us.   We also encourage the use of MyChart, which contains your medical information for your review as well. If you are not enrolled in this feature, an access code is on this after visit summary for your convenience. Thank you for allowing [place] to be involved in your care.  It was great to see you today!  I hope you have a great rest of your Winter!    Hennie Duos. Marletta Lor, D.O. Gastroenterology and Hepatology [facility]

## 2021-10-31 ENCOUNTER — Encounter: Payer: Self-pay | Admitting: Internal Medicine

## 2021-11-27 ENCOUNTER — Other Ambulatory Visit: Payer: Self-pay | Admitting: Internal Medicine

## 2021-11-27 ENCOUNTER — Other Ambulatory Visit (HOSPITAL_COMMUNITY): Payer: Self-pay | Admitting: Internal Medicine

## 2021-11-27 DIAGNOSIS — R1011 Right upper quadrant pain: Secondary | ICD-10-CM

## 2021-11-29 ENCOUNTER — Ambulatory Visit: Payer: Medicaid Other | Admitting: Internal Medicine

## 2021-12-01 ENCOUNTER — Emergency Department (HOSPITAL_COMMUNITY): Payer: Medicaid Other

## 2021-12-01 ENCOUNTER — Other Ambulatory Visit: Payer: Self-pay

## 2021-12-01 ENCOUNTER — Emergency Department (HOSPITAL_COMMUNITY)
Admission: EM | Admit: 2021-12-01 | Discharge: 2021-12-01 | Disposition: A | Discharge status: Home / Self Care | Payer: Medicaid Other | Attending: Emergency Medicine | Admitting: Emergency Medicine

## 2021-12-01 ENCOUNTER — Encounter (HOSPITAL_COMMUNITY): Payer: Self-pay | Admitting: Emergency Medicine

## 2021-12-01 DIAGNOSIS — D72829 Elevated white blood cell count, unspecified: Secondary | ICD-10-CM | POA: Diagnosis not present

## 2021-12-01 DIAGNOSIS — R197 Diarrhea, unspecified: Secondary | ICD-10-CM | POA: Insufficient documentation

## 2021-12-01 DIAGNOSIS — R112 Nausea with vomiting, unspecified: Secondary | ICD-10-CM | POA: Insufficient documentation

## 2021-12-01 LAB — URINALYSIS, ROUTINE W REFLEX MICROSCOPIC
Bilirubin Urine: NEGATIVE
Glucose, UA: NEGATIVE mg/dL
Hgb urine dipstick: NEGATIVE
Ketones, ur: 5 mg/dL — AB
Leukocytes,Ua: NEGATIVE
Nitrite: NEGATIVE
Protein, ur: NEGATIVE mg/dL
Specific Gravity, Urine: 1.021 (ref 1.005–1.030)
pH: 6 (ref 5.0–8.0)

## 2021-12-01 LAB — COMPREHENSIVE METABOLIC PANEL
ALT: 21 U/L (ref 0–44)
AST: 17 U/L (ref 15–41)
Albumin: 3.6 g/dL (ref 3.5–5.0)
Alkaline Phosphatase: 76 U/L (ref 38–126)
Anion gap: 3 — ABNORMAL LOW (ref 5–15)
BUN: 8 mg/dL (ref 6–20)
CO2: 25 mmol/L (ref 22–32)
Calcium: 8.6 mg/dL — ABNORMAL LOW (ref 8.9–10.3)
Chloride: 109 mmol/L (ref 98–111)
Creatinine, Ser: 0.8 mg/dL (ref 0.44–1.00)
GFR, Estimated: >60 mL/min (ref >60)
Glucose, Bld: 90 mg/dL (ref 70–99)
Potassium: 4.2 mmol/L (ref 3.5–5.1)
Sodium: 137 mmol/L (ref 135–145)
Total Bilirubin: 0.4 mg/dL (ref 0.3–1.2)
Total Protein: 6.8 g/dL (ref 6.5–8.1)

## 2021-12-01 LAB — CBC
HCT: 40.2 % (ref 36.0–46.0)
Hemoglobin: 13.1 g/dL (ref 12.0–15.0)
MCH: 27.3 pg (ref 26.0–34.0)
MCHC: 32.6 g/dL (ref 30.0–36.0)
MCV: 83.8 fL (ref 80.0–100.0)
Platelets: 216 10*3/uL (ref 150–400)
RBC: 4.8 MIL/uL (ref 3.87–5.11)
RDW: 13.9 % (ref 11.5–15.5)
WBC: 12 10*3/uL — ABNORMAL HIGH (ref 4.0–10.5)
nRBC: 0 % (ref 0.0–0.2)

## 2021-12-01 LAB — HCG, QUANTITATIVE, PREGNANCY: hCG, Beta Chain, Quant, S: <1 m[IU]/mL (ref <5)

## 2021-12-01 LAB — LIPASE, BLOOD: Lipase: 27 U/L (ref 11–51)

## 2021-12-01 LAB — POC URINE PREG, ED: Preg Test, Ur: NEGATIVE

## 2021-12-01 MED ORDER — SODIUM CHLORIDE 0.9 % IV BOLUS
1000.0000 mL | Freq: Once | INTRAVENOUS | Status: AC
  Administered 2021-12-01: 1000 mL via INTRAVENOUS

## 2021-12-01 MED ORDER — IOHEXOL 300 MG/ML  SOLN
100.0000 mL | Freq: Once | INTRAMUSCULAR | Status: AC | PRN
  Administered 2021-12-01: 100 mL via INTRAVENOUS

## 2021-12-01 NOTE — Discharge Instructions (Signed)
Follow-up with Dr. Jena Gauss for stomach problems and follow-up with Dr. Despina Hidden for the ovary problem seen on ct

## 2021-12-01 NOTE — ED Triage Notes (Signed)
Patient brought in via EMS from home. Alert and oriented. Airway patent. Patient c/o generalized abd pain with nausea, vomiting, and diarrhea that started this morning. Denies any fevers and urinary symptoms. Patient reports bright red blood in stool. Patient states emesis mucus, denies any blood in emesis.

## 2021-12-01 NOTE — ED Provider Notes (Signed)
[facility] EMERGENCY DEPARTMENT Provider Note   CSN: [id number] Arrival date & time: 12/01/21  1508     History {Add pertinent medical, surgical, social history, OB history to HPI:1} Chief Complaint  Patient presents with   Abdominal Pain    Joy Hoffman is a 36 y.o. female.  Patient with obesity.  She complains of nausea vomiting diarrhea   Abdominal Pain      Home Medications Prior to Admission medications   Medication Sig Start Date End Date Taking? Authorizing Provider  Cyanocobalamin (VITAMIN B12 PO) Take by mouth daily.    [provider]  etonogestrel (NEXPLANON) 68 MG IMPL implant 1 each by Subdermal route once.    [provider]  Probiotic Product (PROBIOTIC PO) Take by mouth daily.    [provider]      Allergies    Patient has no known allergies.    Review of Systems   Review of Systems  Gastrointestinal:  Positive for abdominal pain.    Physical Exam Updated Vital Signs BP 99/68   Pulse (!) 59   Temp 98.1 F (36.7 C) (Oral)   Resp 17   Ht 5\' 4"  (1.626 m)   Wt 121.6 kg   SpO2 100%   BMI 46.00 kg/m  Physical Exam  ED Results / Procedures / Treatments   Labs (all labs ordered are listed, but only abnormal results are displayed) Labs Reviewed  COMPREHENSIVE METABOLIC PANEL - Abnormal; Notable for the following components:      Result Value   Calcium 8.6 (*)    Anion gap 3 (*)    All other components within normal limits  CBC - Abnormal; Notable for the following components:   WBC 12.0 (*)    All other components within normal limits  URINALYSIS, ROUTINE W REFLEX MICROSCOPIC - Abnormal; Notable for the following components:   Ketones, ur 5 (*)    All other components within normal limits  LIPASE, BLOOD  HCG, QUANTITATIVE, PREGNANCY  POC URINE PREG, ED  POC OCCULT BLOOD, ED    EKG None  Radiology CT ABDOMEN PELVIS W CONTRAST  Result Date: 12/01/2021 CLINICAL DATA:  Abdominal pain. EXAM: CT ABDOMEN  AND PELVIS WITH CONTRAST TECHNIQUE: Multidetector CT imaging of the abdomen and pelvis was performed using the standard protocol following bolus administration of intravenous contrast. RADIATION DOSE REDUCTION: This exam was performed according to the departmental dose-optimization program which includes automated exposure control, adjustment of the mA and/or kV according to patient size and/or use of iterative reconstruction technique. CONTRAST:  12/03/2021 OMNIPAQUE IOHEXOL 300 MG/ML  SOLN COMPARISON:  None Available. FINDINGS: Lower chest: The visualized lung bases are clear. No intra-abdominal free air.  Small free fluid within the pelvis. Hepatobiliary: Mild fatty liver. No intrahepatic biliary ductal dilatation. The gallbladder is unremarkable. Pancreas: Unremarkable. No pancreatic ductal dilatation or surrounding inflammatory changes. Spleen: Normal in size without focal abnormality. Adrenals/Urinary Tract: The adrenal glands unremarkable. 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 The left kidney is unremarkable. There is no hydronephrosis on either side. The visualized ureters and urinary bladder appear unremarkable. Stomach/Bowel: There is no bowel obstruction or active inflammation. The appendix is normal. Vascular/Lymphatic: Mild aortoiliac atherosclerotic disease. The IVC is unremarkable. No portal venous gas. Reproductive: The uterus is anteverted and grossly unremarkable. There is a 6.5 x 6.0 cm mass with fat and calcium component in the posterior pelvis most consistent with  a teratoma arising from the right ovary. This can predispose to ovarian torsion. There is a 1.8 cm dominant follicle or cyst in the left ovary. Other: Small fat containing umbilical hernia. Musculoskeletal: No acute or significant osseous findings. IMPRESSION: 1. No bowel obstruction. Normal appendix. 2. 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 This can predispose to ovarian torsion. 3. 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 This can be better characterized with MRI without and with contrast on a nonemergent/outpatient basis. 4. Mild fatty liver. 5. Aortic Atherosclerosis (ICD10-I70.0). Electronically Signed   By: Elgie Collard M.D.   On: 12/01/2021 22:53    Procedures Procedures  {Document cardiac monitor, telemetry assessment procedure when appropriate:1}  Medications Ordered in ED Medications  sodium chloride 0.9 % bolus 1,000 mL (1,000 mLs Intravenous New Bag/Given 12/01/21 2145)  iohexol (OMNIPAQUE) 300 MG/ML solution 100 mL (100 mLs Intravenous Contrast Given 12/01/21 2224)    ED Course/ Medical Decision Making/ A&P                           Medical Decision Making Amount and/or Complexity of Data Reviewed Labs: ordered. Radiology: ordered.  Risk Prescription drug management.   CT scan of the abdomen shows teratoma on her ovary.  Otherwise negative.  She is referred to GI and GYN  {Document critical care time when appropriate:1} {Document review of labs and clinical decision tools ie heart score, Chads2Vasc2 etc:1}  {Document your independent review of radiology images, and any outside records:1} {Document your discussion with family members, caretakers, and with consultants:1} {Document social determinants of health affecting pt's care:1} {Document your decision making why or why not admission, treatments were needed:1} Final Clinical Impression(s) / ED Diagnoses Final diagnoses:  Nausea vomiting and diarrhea    Rx / DC Orders ED Discharge Orders     None

## 2021-12-05 ENCOUNTER — Ambulatory Visit: Payer: Medicaid Other | Admitting: Internal Medicine

## 2021-12-06 ENCOUNTER — Ambulatory Visit (HOSPITAL_COMMUNITY): Payer: Medicaid Other

## 2021-12-19 ENCOUNTER — Ambulatory Visit (HOSPITAL_COMMUNITY)
Admission: RE | Admit: 2021-12-19 | Discharge: 2021-12-19 | Disposition: A | Discharge status: Home / Self Care | Payer: Medicaid Other | Source: Ambulatory Visit | Attending: Internal Medicine | Admitting: Internal Medicine

## 2021-12-19 DIAGNOSIS — R1011 Right upper quadrant pain: Secondary | ICD-10-CM | POA: Insufficient documentation

## 2022-01-02 ENCOUNTER — Encounter (INDEPENDENT_AMBULATORY_CARE_PROVIDER_SITE_OTHER): Payer: Self-pay

## 2022-01-02 ENCOUNTER — Other Ambulatory Visit (INDEPENDENT_AMBULATORY_CARE_PROVIDER_SITE_OTHER): Payer: Self-pay

## 2022-01-02 ENCOUNTER — Telehealth (INDEPENDENT_AMBULATORY_CARE_PROVIDER_SITE_OTHER): Payer: Self-pay

## 2022-01-02 ENCOUNTER — Encounter: Payer: Self-pay | Admitting: Internal Medicine

## 2022-01-02 ENCOUNTER — Ambulatory Visit (INDEPENDENT_AMBULATORY_CARE_PROVIDER_SITE_OTHER): Payer: Medicaid Other | Admitting: Internal Medicine

## 2022-01-02 VITALS — BP 119/87 | HR 73 | Temp 98.2°F | Ht 64.0 in | Wt 266.6 lb

## 2022-01-02 DIAGNOSIS — K625 Hemorrhage of anus and rectum: Secondary | ICD-10-CM

## 2022-01-02 DIAGNOSIS — K5904 Chronic idiopathic constipation: Secondary | ICD-10-CM

## 2022-01-02 DIAGNOSIS — K219 Gastro-esophageal reflux disease without esophagitis: Secondary | ICD-10-CM

## 2022-01-02 MED ORDER — PEG 3350-KCL-NA BICARB-NACL 420 G PO SOLR
4000.0000 mL | ORAL | 0 refills | Status: DC
Start: 2022-01-02 — End: 2022-01-25

## 2022-01-02 NOTE — Progress Notes (Signed)
  Referring Provider: Hunter, Denise, MD Primary Care Physician:  Hunter, Denise, MD Primary GI:  Dr. Angelle Isais  Chief Complaint  Patient presents with   Blood In Stools    Has noticed some blood when wiping after a BM. Has a stool about every other day.  First time she noticed the blood she was having diarrhea and right lower side pain and went to ED. Tried dulcolax but states she does not need it anymore.     HPI:   Joy Hoffman is a 36 y.o. female who presents to the clinic today for follow-up visit.  Last seen February 2023.  Has chronic constipation.  Has tried Dulcolax MiraLAX with little  improvement.  Continues to struggle with this.  When I saw her in February she did have 1 episode of bleeding.  We decided to monitor for now.  Since that time she has had multiple other episodes of bright red blood per rectum.  Primarily with bowel movements.  Went to the ER 12/01/2021 for abdominal pain.  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  She is meeting with gynecology in the near future.  Also with chronic GERD, mild, intermittent.  No dysphagia odynophagia.  No epigastric or chest pain.  Right upper quadrant ultrasound 12/19/2021 normal. Past Medical History:  Diagnosis Date   Constipation    Morbid obesity with BMI of 45.0-49.9, adult (HCC)     Past Surgical History:  Procedure Laterality Date   CESAREAN SECTION      Current Outpatient Medications  Medication Sig Dispense Refill   etonogestrel (NEXPLANON) 68 MG IMPL implant 1 each by Subdermal route once.     No current facility-administered medications for this visit.    Allergies as of 01/02/2022   (No Known Allergies)    Family History  Problem Relation Age of Onset   Heart disease Mother    Heart disease Father     Social History   Socioeconomic History   Marital status: Single    Spouse name: Not on file   Number of children: Not on file   Years of education: Not on file    Highest education level: Not on file  Occupational History   Not on file  Tobacco Use   Smoking status: Every Day    Packs/day: 1.00    Types: Cigarettes    Start date: 06/27/2005    Passive exposure: Current   Smokeless tobacco: Never  Vaping Use   Vaping Use: Never used  Substance and Sexual Activity   Alcohol use: Not Currently   Drug use: Not Currently   Sexual activity: Not on file  Other Topics Concern   Not on file  Social History Narrative   Not on file   Social Determinants of Health   Financial Resource Strain: Not on file  Food Insecurity: Not on file  Transportation Needs: Not on file  Physical Activity: Not on file  Stress: Not on file  Social Connections: Not on file    Subjective: Review of Systems  Constitutional:  Negative for chills and fever.  HENT:  Negative for congestion and hearing loss.   Eyes:  Negative for blurred vision and double vision.  Respiratory:  Negative for cough and shortness of breath.   Cardiovascular:  Negative for chest pain and palpitations.  Gastrointestinal:  Positive for blood in stool and constipation. Negative for abdominal pain, diarrhea, heartburn, melena and vomiting.  Genitourinary:  Negative for dysuria and   urgency.  Musculoskeletal:  Negative for joint pain and myalgias.  Skin:  Negative for itching and rash.  Neurological:  Negative for dizziness and headaches.  Psychiatric/Behavioral:  Negative for depression. The patient is not nervous/anxious.      Objective: BP 119/87 (BP Location: Left Arm, Patient Position: Sitting, Cuff Size: Large)   Pulse 73   Temp 98.2 F (36.8 C) (Oral)   Ht 5\' 4"  (1.626 m)   Wt 266 lb 9.6 oz (120.9 kg)   BMI 45.76 kg/m  Physical Exam Constitutional:      Appearance: Normal appearance.  HENT:     Head: Normocephalic and atraumatic.  Eyes:     Extraocular Movements: Extraocular movements intact.     Conjunctiva/sclera: Conjunctivae normal.  Cardiovascular:     Rate and  Rhythm: Normal rate and regular rhythm.  Pulmonary:     Effort: Pulmonary effort is normal.     Breath sounds: Normal breath sounds.  Abdominal:     General: Bowel sounds are normal.     Palpations: Abdomen is soft.  Musculoskeletal:        General: No swelling. Normal range of motion.     Cervical back: Normal range of motion and neck supple.  Skin:    General: Skin is warm and dry.     Coloration: Skin is not jaundiced.  Neurological:     General: No focal deficit present.     Mental Status: She is alert and oriented to person, place, and time.  Psychiatric:        Mood and Affect: Mood normal.        Behavior: Behavior normal.      Assessment: *Rectal bleeding-worsening *Constipation *GERD-mild intermittent  Plan: Rectal bleeding worsening compared to prior visit when she only had a singular episode.  Etiology unclear, likely hemorrhoidal in the setting of chronic constipation.  That being said, need to pursue colonoscopy to rule out other possible causes.  We will schedule today.  The risks including infection, bleed, or perforation as well as benefits, limitations, alternatives and imponderables have been reviewed with the patient. Questions have been answered. All parties agreeable.  GERD, mild intermittent.  No alarm symptoms today.  Recommend she take over-the-counter famotidine as needed.  For her chronic constipation, will give samples of Linzess 145 mcg daily.  Counseled we have room to increase or decrease dose as needed.  Patient to call next week with update and we will send in formal prescription.  Counseled she may have an initial washout period with this medication though this should improve after a few days.  She is agreeable.  Agree with seeing gynecology in regards to recently diagnosed teratoma.  01/02/2022 3:55 PM   Disclaimer: This note was dictated with voice recognition software. Similar sounding words can inadvertently be transcribed and may not be  corrected upon review.

## 2022-01-02 NOTE — Patient Instructions (Signed)
For your chronic constipation, I am going to give you samples of Linzess 145 mcg daily.  We have room to increase or decrease this dose.  Let us know how you are doing next week and we will send in formal prescription.  You may have an initial washout period with this medication though this should improve.  We will also schedule you for colonoscopy to further evaluate your rectal bleeding.  Agree with you seeing gynecology.  It was very nice seeing you again today.  Dr. Marletta Lor

## 2022-01-02 NOTE — H&P (View-Only) (Signed)
Referring Provider: Alvina Filbert, MD Primary Care Physician:  Alvina Filbert, MD Primary GI:  Dr. Marletta Lor  Chief Complaint  Patient presents with   Blood In Stools    Has noticed some blood when wiping after a BM. Has a stool about every other day.  First time she noticed the blood she was having diarrhea and right lower side pain and went to ED. Tried dulcolax but states she does not need it anymore.     HPI:   Joy Hoffman is a 36 y.o. female who presents to the clinic today for follow-up visit.  Last seen February 2023.  Has chronic constipation.  Has tried Dulcolax MiraLAX with little  improvement.  Continues to struggle with this.  When I saw her in February she did have 1 episode of bleeding.  We decided to monitor for now.  Since that time she has had multiple other episodes of bright red blood per rectum.  Primarily with bowel movements.  Went to the ER 12/01/2021 for abdominal pain.  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  She is meeting with gynecology in the near future.  Also with chronic GERD, mild, intermittent.  No dysphagia odynophagia.  No epigastric or chest pain.  Right upper quadrant ultrasound 12/19/2021 normal. Past Medical History:  Diagnosis Date   Constipation    Morbid obesity with BMI of 45.0-49.9, adult [facility])     Past Surgical History:  Procedure Laterality Date   CESAREAN SECTION      Current Outpatient Medications  Medication Sig Dispense Refill   etonogestrel (NEXPLANON) 68 MG IMPL implant 1 each by Subdermal route once.     No current facility-administered medications for this visit.    Allergies as of 01/02/2022   (No Known Allergies)    Family History  Problem Relation Age of Onset   Heart disease Mother    Heart disease Father     Social History   Socioeconomic History   Marital status: Single    Spouse name: Not on file   Number of children: Not on file   Years of education: Not on file    Highest education level: Not on file  Occupational History   Not on file  Tobacco Use   Smoking status: Every Day    Packs/day: 1.00    Types: Cigarettes    Start date: 06/27/2005    Passive exposure: Current   Smokeless tobacco: Never  Vaping Use   Vaping Use: Never used  Substance and Sexual Activity   Alcohol use: Not Currently   Drug use: Not Currently   Sexual activity: Not on file  Other Topics Concern   Not on file  Social History Narrative   Not on file   Social Determinants of Health   Financial Resource Strain: Not on file  Food Insecurity: Not on file  Transportation Needs: Not on file  Physical Activity: Not on file  Stress: Not on file  Social Connections: Not on file    Subjective: Review of Systems  Constitutional:  Negative for chills and fever.  HENT:  Negative for congestion and hearing loss.   Eyes:  Negative for blurred vision and double vision.  Respiratory:  Negative for cough and shortness of breath.   Cardiovascular:  Negative for chest pain and palpitations.  Gastrointestinal:  Positive for blood in stool and constipation. Negative for abdominal pain, diarrhea, heartburn, melena and vomiting.  Genitourinary:  Negative for dysuria and  urgency.  Musculoskeletal:  Negative for joint pain and myalgias.  Skin:  Negative for itching and rash.  Neurological:  Negative for dizziness and headaches.  Psychiatric/Behavioral:  Negative for depression. The patient is not nervous/anxious.      Objective: BP 119/87 (BP Location: Left Arm, Patient Position: Sitting, Cuff Size: Large)   Pulse 73   Temp 98.2 F (36.8 C) (Oral)   Ht 5\' 4"  (1.626 m)   Wt 266 lb 9.6 oz (120.9 kg)   BMI 45.76 kg/m  Physical Exam Constitutional:      Appearance: Normal appearance.  HENT:     Head: Normocephalic and atraumatic.  Eyes:     Extraocular Movements: Extraocular movements intact.     Conjunctiva/sclera: Conjunctivae normal.  Cardiovascular:     Rate and  Rhythm: Normal rate and regular rhythm.  Pulmonary:     Effort: Pulmonary effort is normal.     Breath sounds: Normal breath sounds.  Abdominal:     General: Bowel sounds are normal.     Palpations: Abdomen is soft.  Musculoskeletal:        General: No swelling. Normal range of motion.     Cervical back: Normal range of motion and neck supple.  Skin:    General: Skin is warm and dry.     Coloration: Skin is not jaundiced.  Neurological:     General: No focal deficit present.     Mental Status: She is alert and oriented to person, place, and time.  Psychiatric:        Mood and Affect: Mood normal.        Behavior: Behavior normal.      Assessment: *Rectal bleeding-worsening *Constipation *GERD-mild intermittent  Plan: Rectal bleeding worsening compared to prior visit when she only had a singular episode.  Etiology unclear, likely hemorrhoidal in the setting of chronic constipation.  That being said, need to pursue colonoscopy to rule out other possible causes.  We will schedule today.  The risks including infection, bleed, or perforation as well as benefits, limitations, alternatives and imponderables have been reviewed with the patient. Questions have been answered. All parties agreeable.  GERD, mild intermittent.  No alarm symptoms today.  Recommend she take over-the-counter famotidine as needed.  For her chronic constipation, will give samples of Linzess 145 mcg daily.  Counseled we have room to increase or decrease dose as needed.  Patient to call next week with update and we will send in formal prescription.  Counseled she may have an initial washout period with this medication though this should improve after a few days.  She is agreeable.  Agree with seeing gynecology in regards to recently diagnosed teratoma.  01/02/2022 3:55 PM   Disclaimer: This note was dictated with voice recognition software. Similar sounding words can inadvertently be transcribed and may not be  corrected upon review.

## 2022-01-02 NOTE — Telephone Encounter (Signed)
Joy Hoffman Ann Abdo Denault, CMA  ?

## 2022-01-03 ENCOUNTER — Encounter (INDEPENDENT_AMBULATORY_CARE_PROVIDER_SITE_OTHER): Payer: Self-pay

## 2022-01-08 ENCOUNTER — Other Ambulatory Visit (HOSPITAL_COMMUNITY)
Admission: RE | Admit: 2022-01-08 | Discharge: 2022-01-08 | Disposition: A | Discharge status: Home / Self Care | Payer: Medicaid Other | Source: Ambulatory Visit | Attending: Obstetrics & Gynecology | Admitting: Obstetrics & Gynecology

## 2022-01-08 ENCOUNTER — Encounter: Payer: Self-pay | Admitting: Obstetrics & Gynecology

## 2022-01-08 ENCOUNTER — Ambulatory Visit (INDEPENDENT_AMBULATORY_CARE_PROVIDER_SITE_OTHER): Payer: Medicaid Other | Admitting: Obstetrics & Gynecology

## 2022-01-08 VITALS — BP 119/72 | HR 68 | Ht 64.0 in | Wt 265.8 lb

## 2022-01-08 DIAGNOSIS — Z124 Encounter for screening for malignant neoplasm of cervix: Secondary | ICD-10-CM | POA: Insufficient documentation

## 2022-01-08 DIAGNOSIS — Z720 Tobacco use: Secondary | ICD-10-CM | POA: Diagnosis not present

## 2022-01-08 DIAGNOSIS — Z3009 Encounter for other general counseling and advice on contraception: Secondary | ICD-10-CM

## 2022-01-08 DIAGNOSIS — N83201 Unspecified ovarian cyst, right side: Secondary | ICD-10-CM | POA: Diagnosis not present

## 2022-01-08 DIAGNOSIS — Z98891 History of uterine scar from previous surgery: Secondary | ICD-10-CM

## 2022-01-08 DIAGNOSIS — Z975 Presence of (intrauterine) contraceptive device: Secondary | ICD-10-CM

## 2022-01-08 NOTE — Progress Notes (Signed)
GYN VISIT Patient name: Joy Hoffman MRN [id number]  Date of birth: [birth year] Chief Complaint:   Teratoma (Right sided intermittent pain)  History of Present Illness:   Joy Hoffman is a 36 y.o. [phone] female being seen today for ovarian cyst.  Initially pt had presented to ER end of June due blood in stool/GI upset- work up had included CT which showed an ovarian mass.  Today she notes, occasionally right-sided pain, intermittent, numbness type feeling.  Pain has been going on for about a week.  It has not required any pain medication.  Denies nausea/vomiting.  No change in appetite.  Contraception: Nexplanon placed this past year.  No menses with current form of contraception.  No other bleeding.  CT reviewed: Normal uterus.  6.5x6cm right ovarian mass- consistent with teratoma.  Normal left ovary     Of note, rectal bleeding is still occurring, pt has scheduled colonoscopy.  As part of surgical prep- records reviewed.  Prior C-section x 2  No LMP recorded. Patient has had an implant. Nexplanon Feb 2023     01/08/2022   11:12 AM  Depression screen PHQ 2/9  Decreased Interest 0  Down, Depressed, Hopeless 0  PHQ - 2 Score 0  Altered sleeping 0  Tired, decreased energy 0  Change in appetite 0  Feeling bad or failure about yourself  0  Trouble concentrating 0  Moving slowly or fidgety/restless 0  Suicidal thoughts 0  PHQ-9 Score 0     Review of Systems:   Pertinent items are noted in HPI Denies fever/chills, dizziness, headaches, visual disturbances, fatigue, shortness of breath, chest pain, abdominal pain, vomiting, no problems with periods, urination, or intercourse unless otherwise stated above.  Pertinent History Reviewed:  Reviewed past medical,surgical, social, obstetrical and family history.  Reviewed problem list, medications and allergies. Physical Assessment:   Vitals:   01/08/22 1109  BP: 119/72  Pulse: 68  Weight: 265 lb 12.8 oz (120.6 kg)  Height: 5\' 4"   (1.626 m)  Body mass index is 45.62 kg/m.       Physical Examination:   General appearance: alert, well appearing, and in no distress  Psych: mood appropriate, normal affect  Skin: warm & dry   Cardiovascular: normal heart rate noted  Respiratory: normal respiratory effort, no distress  Abdomen: obese, soft, non-tender, no rebound, no guarding  Pelvic: VULVA: normal appearing vulva with no masses, tenderness or lesions, VAGINA: normal appearing vagina with normal color and discharge, no lesions, CERVIX: normal appearing cervix without discharge or lesions, UTERUS: uterus is normal size, shape, consistency and nontender, ADNEXA:  right adnexal fullness appreciated  Extremities: no edema, right arm with Nexplanon  Chaperone: Latisha Cresenzo    Assessment & Plan:  1) Right ovarian cyst-teratoma -discussed findings suggestive of teratoma.  While most teratomas are benign.  Recommendation is to proceed with surgical removal -additionally, would recommend pelvic US for further evaluation -Reviewed risk/benefit and alternatives of surgery including but not limited to risk of bleeding, infection and injury to surrounding organs -discussed due to prior surgeries scar tissue may be present which increase risk of complications -Questions and concerns were addressed and desires to proceed -Reviewed recovery and hospital/same day surgery  2) Contraceptive management -nexplanon in placed -discussed if pt has completed child-bearing, may also consider salpingectomy at the time of procedure -discussed risk/benefits- pt reviewed with her partner and agreeable  -may also consider removal of nexplanon at time of procedure.  Reason to keep device in place is  to help with menses  For now plan for laparoscopic right oophorectomy and bilateral salpingectomy -will review salpingectomy at preop appointment  3) Tobacco use -strongly encouraged pt to quit to help improve surgical outcomes/recovery  4)  Preventive screening -pap completed, reviewed ASCCP guidelines   Return for TVUS ideally in our office (before Oct 1st) and preop visit in Sept.   Myna Hidalgo, DO Attending Obstetrician & Gynecologist, [facility] [employer], [facility]

## 2022-01-09 ENCOUNTER — Encounter: Payer: Self-pay | Admitting: Obstetrics & Gynecology

## 2022-01-14 LAB — CYTOLOGY - PAP
Chlamydia: NEGATIVE
Comment: NEGATIVE
Comment: NEGATIVE
Comment: NORMAL
Diagnosis: NEGATIVE
High risk HPV: NEGATIVE
Neisseria Gonorrhea: NEGATIVE

## 2022-01-21 NOTE — Patient Instructions (Signed)
Joy Hoffman  01/21/2022     @PREFPERIOPPHARMACY @   Your procedure is scheduled on  01/25/2022.   Report to 03/27/2022 at  0730  A.M.   Call this number if you have problems the morning of surgery:  [phone]   Remember:  Follow the diet and prep instructions given to you by the office.     Take these medicines the morning of surgery with A SIP OF WATER                                                None.     Do not wear jewelry, make-up or nail polish.  Do not wear lotions, powders, or perfumes, or deodorant.  Do not shave 48 hours prior to surgery.  Men may shave face and neck.  Do not bring valuables to the hospital.  [facility] is not responsible for any belongings or valuables.  Contacts, dentures or bridgework may not be worn into surgery.  Leave your suitcase in the car.  After surgery it may be brought to your room.  For patients admitted to the hospital, discharge time will be determined by your treatment team.  Patients discharged the day of surgery will not be allowed to drive home and must have someone with them for 24 hours.    Special instructions:   DO NOT smoke tobacco or vape for 24 hours before your procedure.  Please read over the following fact sheets that you were given. Anesthesia Post-op Instructions and Care and Recovery After Surgery      Colonoscopy, Adult, Care After The following information offers guidance on how to care for yourself after your procedure. Your health care provider may also give you more specific instructions. If you have problems or questions, contact your health care provider. What can I expect after the procedure? After the procedure, it is common to have: A small amount of blood in your stool for 24 hours after the procedure. Some gas. Mild cramping or bloating of your abdomen. Follow these instructions at home: Eating and drinking  Drink enough fluid to keep your urine pale yellow. Follow instructions  from your health care provider about eating or drinking restrictions. Resume your normal diet as told by your health care provider. Avoid heavy or fried foods that are hard to digest. Activity Rest as told by your health care provider. Avoid sitting for a long time without moving. Get up to take short walks every 1-2 hours. This is important to improve blood flow and breathing. Ask for help if you feel weak or unsteady. Return to your normal activities as told by your health care provider. Ask your health care provider what activities are safe for you. Managing cramping and bloating  Try walking around when you have cramps or feel bloated. If directed, apply heat to your abdomen as told by your health care provider. Use the heat source that your health care provider recommends, such as a moist heat pack or a heating pad. Place a towel between your skin and the heat source. Leave the heat on for 20-30 minutes. Remove the heat if your skin turns bright red. This is especially important if you are unable to feel pain, heat, or cold. You have a greater risk of getting burned. General instructions If you were given a  sedative during the procedure, it can affect you for several hours. Do not drive or operate machinery until your health care provider says that it is safe. For the first 24 hours after the procedure: Do not sign important documents. Do not drink alcohol. Do your regular daily activities at a slower pace than normal. Eat soft foods that are easy to digest. Take over-the-counter and prescription medicines only as told by your health care provider. Keep all follow-up visits. This is important. Contact a health care provider if: You have blood in your stool 2-3 days after the procedure. Get help right away if: You have more than a small spotting of blood in your stool. You have large blood clots in your stool. You have swelling of your abdomen. You have nausea or vomiting. You have a  fever. You have increasing pain in your abdomen that is not relieved with medicine. These symptoms may be an emergency. Get help right away. Call 911. Do not wait to see if the symptoms will go away. Do not drive yourself to the hospital. Summary After the procedure, it is common to have a small amount of blood in your stool. You may also have mild cramping and bloating of your abdomen. If you were given a sedative during the procedure, it can affect you for several hours. Do not drive or operate machinery until your health care provider says that it is safe. Get help right away if you have a lot of blood in your stool, nausea or vomiting, a fever, or increased pain in your abdomen. This information is not intended to replace advice given to you by your health care provider. Make sure you discuss any questions you have with your health care provider. Document Revised: 01/24/2021 Document Reviewed: 01/24/2021 Elsevier Patient Education  2023 [employer] Monitored Anesthesia Care, Care After This sheet gives you information about how to care for yourself after your procedure. Your health care provider may also give you more specific instructions. If you have problems or questions, contact your health care provider. What can I expect after the procedure? After the procedure, it is common to have: Tiredness. Forgetfulness about what happened after the procedure. Impaired judgment for important decisions. Nausea or vomiting. Some difficulty with balance. Follow these instructions at home: For the time period you were told by your health care provider:     Rest as needed. Do not participate in activities where you could fall or become injured. Do not drive or use machinery. Do not drink alcohol. Do not take sleeping pills or medicines that cause drowsiness. Do not make important decisions or sign legal documents. Do not take care of children on your own. Eating and drinking Follow the  diet that is recommended by your health care provider. Drink enough fluid to keep your urine pale yellow. If you vomit: Drink water, juice, or soup when you can drink without vomiting. Make sure you have little or no nausea before eating solid foods. General instructions Have a responsible adult stay with you for the time you are told. It is important to have someone help care for you until you are awake and alert. Take over-the-counter and prescription medicines only as told by your health care provider. If you have sleep apnea, surgery and certain medicines can increase your risk for breathing problems. Follow instructions from your health care provider about wearing your sleep device: Anytime you are sleeping, including during daytime naps. While taking prescription pain medicines, sleeping medicines, or medicines  that make you drowsy. Avoid smoking. Keep all follow-up visits as told by your health care provider. This is important. Contact a health care provider if: You keep feeling nauseous or you keep vomiting. You feel light-headed. You are still sleepy or having trouble with balance after 24 hours. You develop a rash. You have a fever. You have redness or swelling around the IV site. Get help right away if: You have trouble breathing. You have new-onset confusion at home. Summary For several hours after your procedure, you may feel tired. You may also be forgetful and have poor judgment. Have a responsible adult stay with you for the time you are told. It is important to have someone help care for you until you are awake and alert. Rest as told. Do not drive or operate machinery. Do not drink alcohol or take sleeping pills. Get help right away if you have trouble breathing, or if you suddenly become confused. This information is not intended to replace advice given to you by your health care provider. Make sure you discuss any questions you have with your health care  provider. Document Revised: 05/08/2021 Document Reviewed: 05/06/2019 Elsevier Patient Education  2023 [employer].

## 2022-01-23 ENCOUNTER — Encounter (HOSPITAL_COMMUNITY)
Admission: RE | Admit: 2022-01-23 | Discharge: 2022-01-23 | Disposition: A | Discharge status: Home / Self Care | Payer: Medicaid Other | Source: Ambulatory Visit | Attending: Internal Medicine | Admitting: Internal Medicine

## 2022-01-23 ENCOUNTER — Encounter (HOSPITAL_COMMUNITY): Payer: Self-pay

## 2022-01-23 VITALS — BP 120/74 | HR 89 | Temp 98.2°F | Resp 18 | Ht 64.0 in | Wt 265.8 lb

## 2022-01-23 DIAGNOSIS — Z01818 Encounter for other preprocedural examination: Secondary | ICD-10-CM | POA: Insufficient documentation

## 2022-01-23 LAB — POCT PREGNANCY, URINE: Preg Test, Ur: NEGATIVE

## 2022-01-25 ENCOUNTER — Encounter (HOSPITAL_COMMUNITY): Payer: Self-pay

## 2022-01-25 ENCOUNTER — Encounter (HOSPITAL_COMMUNITY)
Admission: RE | Disposition: A | Discharge status: Home / Self Care | Payer: Self-pay | Source: Home / Self Care | Attending: Internal Medicine

## 2022-01-25 ENCOUNTER — Ambulatory Visit (HOSPITAL_BASED_OUTPATIENT_CLINIC_OR_DEPARTMENT_OTHER): Payer: Medicaid Other | Admitting: Certified Registered"

## 2022-01-25 ENCOUNTER — Ambulatory Visit (HOSPITAL_COMMUNITY): Payer: Medicaid Other | Admitting: Certified Registered"

## 2022-01-25 ENCOUNTER — Ambulatory Visit (HOSPITAL_COMMUNITY)
Admission: RE | Admit: 2022-01-25 | Discharge: 2022-01-25 | Disposition: A | Discharge status: Home / Self Care | Payer: Medicaid Other | Attending: Internal Medicine | Admitting: Internal Medicine

## 2022-01-25 DIAGNOSIS — K625 Hemorrhage of anus and rectum: Secondary | ICD-10-CM

## 2022-01-25 DIAGNOSIS — K648 Other hemorrhoids: Secondary | ICD-10-CM | POA: Insufficient documentation

## 2022-01-25 DIAGNOSIS — K635 Polyp of colon: Secondary | ICD-10-CM | POA: Insufficient documentation

## 2022-01-25 DIAGNOSIS — F17210 Nicotine dependence, cigarettes, uncomplicated: Secondary | ICD-10-CM | POA: Insufficient documentation

## 2022-01-25 DIAGNOSIS — K219 Gastro-esophageal reflux disease without esophagitis: Secondary | ICD-10-CM | POA: Diagnosis not present

## 2022-01-25 DIAGNOSIS — Z6841 Body Mass Index (BMI) 40.0 and over, adult: Secondary | ICD-10-CM | POA: Diagnosis not present

## 2022-01-25 DIAGNOSIS — K5909 Other constipation: Secondary | ICD-10-CM | POA: Diagnosis not present

## 2022-01-25 DIAGNOSIS — Z01818 Encounter for other preprocedural examination: Secondary | ICD-10-CM

## 2022-01-25 HISTORY — PX: POLYPECTOMY: SHX5525

## 2022-01-25 HISTORY — PX: COLONOSCOPY WITH PROPOFOL: SHX5780

## 2022-01-25 SURGERY — COLONOSCOPY WITH PROPOFOL
Anesthesia: General

## 2022-01-25 MED ORDER — LACTATED RINGERS IV SOLN
INTRAVENOUS | Status: DC
Start: 2022-01-25 — End: 2022-01-25
  Administered 2022-01-25: 1000 mL via INTRAVENOUS

## 2022-01-25 MED ORDER — PROPOFOL 10 MG/ML IV BOLUS
INTRAVENOUS | Status: DC | PRN
  Administered 2022-01-25: 20 mg via INTRAVENOUS
  Administered 2022-01-25: 40 mg via INTRAVENOUS

## 2022-01-25 MED ORDER — LIDOCAINE HCL (CARDIAC) PF 100 MG/5ML IV SOSY
PREFILLED_SYRINGE | INTRAVENOUS | Status: DC | PRN
  Administered 2022-01-25: 50 mg via INTRAVENOUS

## 2022-01-25 MED ORDER — PROPOFOL 500 MG/50ML IV EMUL
INTRAVENOUS | Status: DC | PRN
  Administered 2022-01-25: 200 ug/kg/min via INTRAVENOUS

## 2022-01-25 MED ORDER — LINACLOTIDE 145 MCG PO CAPS: 145.0000 ug | ORAL_CAPSULE | Freq: Every day | ORAL | 3 refills | Status: DC

## 2022-01-25 NOTE — Interval H&P Note (Signed)
History and Physical Interval Note:  01/25/2022 8:33 AM  Joy Hoffman  has presented today for surgery, with the diagnosis of Rectal Bleeding.  The various methods of treatment have been discussed with the patient and family. After consideration of risks, benefits and other options for treatment, the patient has consented to  Procedure(s) with comments: COLONOSCOPY WITH PROPOFOL (N/A) - 900 ASA 3 as a surgical intervention.  The patient's history has been reviewed, patient examined, no change in status, stable for surgery.  I have reviewed the patient's chart and labs.  Questions were answered to the patient's satisfaction.     Lanelle Bal

## 2022-01-25 NOTE — Discharge Instructions (Signed)
  Colonoscopy Discharge Instructions  Read the instructions outlined below and refer to this sheet in the next few weeks. These discharge instructions provide you with general information on caring for yourself after you leave the hospital. Your doctor may also give you specific instructions. While your treatment has been planned according to the most current medical practices available, unavoidable complications occasionally occur.   ACTIVITY You may resume your regular activity, but move at a slower pace for the next 24 hours.  Take frequent rest periods for the next 24 hours.  Walking will help get rid of the air and reduce the bloated feeling in your belly (abdomen).  No driving for 24 hours (because of the medicine (anesthesia) used during the test).   Do not sign any important legal documents or operate any machinery for 24 hours (because of the anesthesia used during the test).  NUTRITION Drink plenty of fluids.  You may resume your normal diet as instructed by your doctor.  Begin with a light meal and progress to your normal diet. Heavy or fried foods are harder to digest and may make you feel sick to your stomach (nauseated).  Avoid alcoholic beverages for 24 hours or as instructed.  MEDICATIONS You may resume your normal medications unless your doctor tells you otherwise.  WHAT YOU CAN EXPECT TODAY Some feelings of bloating in the abdomen.  Passage of more gas than usual.  Spotting of blood in your stool or on the toilet paper.  IF YOU HAD POLYPS REMOVED DURING THE COLONOSCOPY: No aspirin products for 7 days or as instructed.  No alcohol for 7 days or as instructed.  Eat a soft diet for the next 24 hours.  FINDING OUT THE RESULTS OF YOUR TEST Not all test results are available during your visit. If your test results are not back during the visit, make an appointment with your caregiver to find out the results. Do not assume everything is normal if you have not heard from your  caregiver or the medical facility. It is important for you to follow up on all of your test results.  SEEK IMMEDIATE MEDICAL ATTENTION IF: You have more than a spotting of blood in your stool.  Your belly is swollen (abdominal distention).  You are nauseated or vomiting.  You have a temperature over 101.  You have abdominal pain or discomfort that is severe or gets worse throughout the day.   Your colonoscopy revealed internal hemorrhoids which is likely the cause of your bleeding.  We can set you up for hemorrhoid banding to have these treated if you would like.  You did have 4 polyps which I removed successfully.  These are likely benign hyperplastic nothing to worry about.  Await pathology results my office will contact you.  Likely repeat colonoscopy at age 48 for screening purposes.  I hope you have a great rest of your week!  Hennie Duos. Marletta Lor, D.O. Gastroenterology and Hepatology [facility]

## 2022-01-25 NOTE — Op Note (Signed)
[facility] Patient Name: Joy Hoffman Procedure Date: 01/25/2022 8:34 AM MRN: [id number] Date of Birth: 11-25-1985 Attending MD: Elon Alas. Abbey Chatters DO CSN: [id number] Age: 36 Admit Type: Outpatient Procedure:                Colonoscopy Indications:              Rectal bleeding Providers:                Elon Alas. Abbey Chatters, DO, Caprice Kluver, [place]                            Risa Grill, Technician, Bethel Born,                            [employer] Referring MD:              Medicines:                See the Anesthesia note for documentation of the                            administered medications Complications:            No immediate complications. Estimated Blood Loss:     Estimated blood loss was minimal. Procedure:                Pre-Anesthesia Assessment:                           - The anesthesia plan was to use monitored                            anesthesia care (MAC).                           After obtaining informed consent, the colonoscope                            was passed under direct vision. Throughout the                            procedure, the patient's blood pressure, pulse, and                            oxygen saturations were monitored continuously. The                            [id number]-HQ190L ([id number]) scope was introduced through                            the anus and advanced to the the cecum, identified                            by appendiceal orifice and ileocecal valve. The                            colonoscopy was performed without difficulty. The  patient tolerated the procedure well. The quality                            of the bowel preparation was evaluated using the                            BBPS [facility] Bowel Preparation Scale) with scores                            of: Right Colon = 3, Transverse Colon = 3 and Left                            Colon = 3 (entire mucosa seen well with no residual                             staining, small fragments of stool or opaque                            liquid). The total BBPS score equals 9. Scope In: 8:46:35 AM Scope Out: 8:55:57 AM Scope Withdrawal Time: 0 hours 7 minutes 57 seconds  Total Procedure Duration: 0 hours 9 minutes 22 seconds  Findings:      The perianal and digital rectal examinations were normal.      Non-bleeding internal hemorrhoids were found during retroflexion.      Four hyperplastic polyps were found in the sigmoid colon. The polyps       were 4 to 6 mm in size. These polyps were removed with a cold snare.       Resection and retrieval were complete.      The exam was otherwise without abnormality. Impression:               - Non-bleeding internal hemorrhoids.                           - Four 4 to 6 mm polyps in the sigmoid colon,                            removed with a cold snare. Resected and retrieved.                           - The examination was otherwise normal. Moderate Sedation:      Per Anesthesia Care Recommendation:           - Patient has a contact number available for                            emergencies. The signs and symptoms of potential                            delayed complications were discussed with the                            patient. Return to normal activities tomorrow.  Written discharge instructions were provided to the                            patient.                           - Resume previous diet.                           - Continue present medications.                           - Await pathology results.                           - Repeat colonoscopy at age 71 or sooner if higher                            risk for screening purposes.                           - Return to GI clinic PRN for hemorrhoid banding                            with Roseanne Kaufman if bleeding continues. Procedure Code(s):        --- Professional ---                            [phone], Colonoscopy, flexible; with removal of                            tumor(s), polyp(s), or other lesion(s) by snare                            technique Diagnosis Code(s):        --- Professional ---                           K63.5, Polyp of colon                           K64.8, Other hemorrhoids                           K62.5, Hemorrhage of anus and rectum CPT copyright 2019 American Medical Association. All rights reserved. The codes documented in this report are preliminary and upon coder review may  be revised to meet current compliance requirements. Elon Alas. Abbey Chatters, DO [place] Abbey Chatters, DO 01/25/2022 8:57:57 AM This report has been signed electronically. Number of Addenda: 0

## 2022-01-25 NOTE — Anesthesia Postprocedure Evaluation (Signed)
Anesthesia Post Note  Patient: Joy Hoffman  Procedure(s) Performed: COLONOSCOPY WITH PROPOFOL POLYPECTOMY  Patient location during evaluation: Phase II Anesthesia Type: General Level of consciousness: awake and alert and oriented Pain management: pain level controlled Vital Signs Assessment: post-procedure vital signs reviewed and stable Respiratory status: spontaneous breathing, nonlabored ventilation and respiratory function stable Cardiovascular status: blood pressure returned to baseline and stable Postop Assessment: no apparent nausea or vomiting Anesthetic complications: no   No notable events documented.   Last Vitals:  Vitals:   01/25/22 0859 01/25/22 0903  BP: (!) 82/40 (!) 91/51  Pulse: 81   Resp:    Temp: 36.4 C   SpO2: 96%     Last Pain:  Vitals:   01/25/22 0859  TempSrc: Oral  PainSc: 0-No pain                 Terius Jacuinde C [place]

## 2022-01-25 NOTE — Transfer of Care (Signed)
Immediate Anesthesia Transfer of Care Note  Patient: Joy Hoffman  Procedure(s) Performed: COLONOSCOPY WITH PROPOFOL POLYPECTOMY  Patient Location: PACU  Anesthesia Type:MAC  Level of Consciousness: drowsy and patient cooperative  Airway & Oxygen Therapy: Patient Spontanous Breathing  Post-op Assessment: Report given to RN and Post -op Vital signs reviewed and stable  Post vital signs: Reviewed and stable  Last Vitals:  Vitals Value Taken Time  BP 91/51 01/25/22 0903  Temp 36.4 C 01/25/22 0859  Pulse 81 01/25/22 0859  Resp 16   SpO2 96 % 01/25/22 0859    Last Pain:  Vitals:   01/25/22 0859  TempSrc: Oral  PainSc: 0-No pain      Patients Stated Pain Goal: 9 (85/02/77 4128)  Complications: No notable events documented.

## 2022-01-25 NOTE — Anesthesia Preprocedure Evaluation (Signed)
Anesthesia Evaluation  Patient identified by MRN, date of birth, ID band Patient awake    Reviewed: Allergy & Precautions, NPO status , Patient's Chart, lab work & pertinent test results  Airway Mallampati: III  TM Distance: >3 FB Neck ROM: Full    Dental  (+) Dental Advisory Given, Missing   Pulmonary Current Smoker and Patient abstained from smoking.,    Pulmonary exam normal breath sounds clear to auscultation       Cardiovascular Exercise Tolerance: Good negative cardio ROS Normal cardiovascular exam Rhythm:Regular Rate:Normal     Neuro/Psych negative neurological ROS  negative psych ROS   GI/Hepatic Neg liver ROS, GERD  Controlled,  Endo/Other  Morbid obesity  Renal/GU negative Renal ROS  negative genitourinary   Musculoskeletal negative musculoskeletal ROS (+)   Abdominal   Peds negative pediatric ROS (+)  Hematology negative hematology ROS (+)   Anesthesia Other Findings   Reproductive/Obstetrics negative OB ROS                             Anesthesia Physical Anesthesia Plan  ASA: 3  Anesthesia Plan: General   Post-op Pain Management: Minimal or no pain anticipated   Induction: Intravenous  PONV Risk Score and Plan: Propofol infusion  Airway Management Planned: Nasal Cannula and Natural Airway  Additional Equipment:   Intra-op Plan:   Post-operative Plan:   Informed Consent: I have reviewed the patients History and Physical, chart, labs and discussed the procedure including the risks, benefits and alternatives for the proposed anesthesia with the patient or authorized representative who has indicated his/her understanding and acceptance.     Dental advisory given  Plan Discussed with: CRNA and Surgeon  Anesthesia Plan Comments:         Anesthesia Quick Evaluation

## 2022-01-28 LAB — SURGICAL PATHOLOGY

## 2022-01-30 ENCOUNTER — Encounter (HOSPITAL_COMMUNITY): Payer: Self-pay | Admitting: Internal Medicine

## 2022-02-13 ENCOUNTER — Encounter: Payer: Medicaid Other | Admitting: Obstetrics & Gynecology

## 2022-02-14 ENCOUNTER — Encounter: Payer: Medicaid Other | Admitting: Obstetrics & Gynecology

## 2022-02-21 ENCOUNTER — Encounter: Payer: Medicaid Other | Admitting: Obstetrics & Gynecology

## 2022-03-07 ENCOUNTER — Encounter: Payer: Self-pay | Admitting: Gastroenterology

## 2022-03-07 ENCOUNTER — Ambulatory Visit (INDEPENDENT_AMBULATORY_CARE_PROVIDER_SITE_OTHER): Payer: Medicaid Other | Admitting: Gastroenterology

## 2022-03-07 VITALS — BP 93/68 | HR 69 | Temp 97.3°F | Ht 64.0 in | Wt 266.2 lb

## 2022-03-07 DIAGNOSIS — K5904 Chronic idiopathic constipation: Secondary | ICD-10-CM | POA: Diagnosis not present

## 2022-03-07 MED ORDER — HYDROCORTISONE (PERIANAL) 2.5 % EX CREA: 1.0000 | TOPICAL_CREAM | Freq: Two times a day (BID) | CUTANEOUS | 1 refills | Status: DC

## 2022-03-07 NOTE — Patient Instructions (Signed)
I recommend taking 2 teaspoons of benefiber in the beverage of your choice each day. If this is not helpful to help have bowel movement, let me know!  I have sent in a cream to use twice a day per rectum. It is important to avoid straining, only sit on toilet for 2-3 minutes, and avoid constipation.  I will see you in 4 weeks and we can consider banding at that time!  Please call if you still have discomfort when passing a bowel movement.  It was a pleasure to see you today. I want to create trusting relationships with patients to provide genuine, compassionate, and quality care. I value your feedback. If you receive a survey regarding your visit,  I greatly appreciate you taking time to fill this out.   Annitta Needs, PhD, ANP-BC [facility]

## 2022-03-07 NOTE — Progress Notes (Signed)
hemorr 

## 2022-03-07 NOTE — Progress Notes (Signed)
Gastroenterology Office Note     Primary Care Physician:  Abran Richard, MD  Primary Gastroenterologist:   Chief Complaint   Chief Complaint  Patient presents with   Hemorrhoids    Pt here for a banding     History of Present Illness   Joy Hoffman is a 36 y.o. female presenting today in follow-up from colonoscopy to discuss banding.  Some discomfort with BM. Mild constipation. No significant rectal bleeding.     Past Medical History:  Diagnosis Date   Constipation    GERD (gastroesophageal reflux disease)    Morbid obesity with BMI of 45.0-49.9, adult [facility])     Past Surgical History:  Procedure Laterality Date   CESAREAN SECTION     COLONOSCOPY WITH PROPOFOL N/A 01/25/2022   Procedure: COLONOSCOPY WITH PROPOFOL;  Surgeon: Eloise Harman, DO;  Location: [facility];  Service: Endoscopy;  Laterality: N/A;  900 ASA 3   ECTOPIC PREGNANCY SURGERY  2010   POLYPECTOMY  01/25/2022   Procedure: POLYPECTOMY;  Surgeon: Eloise Harman, DO;  Location: [facility];  Service: Endoscopy;;    Current Outpatient Medications  Medication Sig Dispense Refill   etonogestrel (NEXPLANON) 68 MG IMPL implant 1 each by Subdermal route once.     hydrocortisone (ANUSOL-HC) 2.5 % rectal cream Place 1 Application rectally 2 (two) times daily. 30 g 1   Plecanatide (TRULANCE) 3 MG TABS Take 3 mg by mouth daily. 30 tablet 3   No current facility-administered medications for this visit.    Allergies as of 03/07/2022   (No Known Allergies)    Family History  Problem Relation Age of Onset   Diabetes Maternal Grandfather    Heart disease Father    Heart disease Mother     Social History   Socioeconomic History   Marital status: Single    Spouse name: Not on file   Number of children: Not on file   Years of education: Not on file   Highest education level: Not on file  Occupational History   Not on file  Tobacco Use   Smoking status: Every Day    Packs/day: 1.00     Types: Cigarettes    Start date: 06/27/2005    Passive exposure: Current   Smokeless tobacco: Never  Vaping Use   Vaping Use: Never used  Substance and Sexual Activity   Alcohol use: Not Currently   Drug use: Not Currently   Sexual activity: Yes    Birth control/protection: None, Implant  Other Topics Concern   Not on file  Social History Narrative   Not on file   Social Determinants of Health   Financial Resource Strain: Low Risk  (01/08/2022)   Overall Financial Resource Strain (CARDIA)    Difficulty of Paying Living Expenses: Not very hard  Food Insecurity: Food Insecurity Present (01/08/2022)   Hunger Vital Sign    Worried About [place] in the Last Year: Sometimes true    Ran Out of Food in the Last Year: Sometimes true  Transportation Needs: No Transportation Needs (01/08/2022)   PRAPARE - Hydrologist (Medical): No    Lack of Transportation (Non-Medical): No  Physical Activity: Inactive (01/08/2022)   Exercise Vital Sign    Days of Exercise per Week: 0 days    Minutes of Exercise per Session: 0 min  Stress: No Stress Concern Present (01/08/2022)   [place]  Stress Questionnaire    Feeling of Stress : Not at all  Social Connections: Moderately Isolated (01/08/2022)   Social Connection and Isolation Panel [NHANES]    Frequency of Communication with Friends and Family: More than three times a week    Frequency of Social Gatherings with Friends and Family: Once a week    Attends Religious Services: Never    Database administrator or Organizations: No    Attends Banker Meetings: Never    Marital Status: Married  Catering manager Violence: Not At Risk (01/08/2022)   Humiliation, Afraid, Rape, and Kick questionnaire    Fear of Current or Ex-Partner: No    Emotionally Abused: No    Physically Abused: No    Sexually Abused: No     Review of Systems   Gen: Denies any  fever, chills, fatigue, weight loss, lack of appetite.  CV: Denies chest pain, heart palpitations, peripheral edema, syncope.  Resp: Denies shortness of breath at rest or with exertion. Denies wheezing or cough.  GI: Denies dysphagia or odynophagia. Denies jaundice, hematemesis, fecal incontinence. GU : Denies urinary burning, urinary frequency, urinary hesitancy MS: Denies joint pain, muscle weakness, cramps, or limitation of movement.  Derm: Denies rash, itching, dry skin Psych: Denies depression, anxiety, memory loss, and confusion Heme: Denies bruising, bleeding, and enlarged lymph nodes.   Physical Exam   BP 93/68   Pulse 69   Temp (!) 97.3 F (36.3 C)   Ht 5\' 4"  (1.626 m)   Wt 266 lb 3.2 oz (120.7 kg)   BMI 45.69 kg/m  General:   Alert and oriented. Pleasant and cooperative. Well-nourished and well-developed.  Head:  Normocephalic and atraumatic. Eyes:  Without icterus Rectal:  Discomfort with DRE> holding off on banding today.  Msk:  Symmetrical without gross deformities. Normal posture. Extremities:  Without edema. Neurologic:  Alert and  oriented x4;  grossly normal neurologically. Skin:  Intact without significant lesions or rashes. Psych:  Alert and cooperative. Normal mood and affect.   Assessment   Joy Hoffman is a 36 y.o. female presenting today in follow-up with a history of rectal bleeding. She is also experiencing constipation.  No banding today as she had discomfort with DRE. Will start Anusol for now and she is to call if this is not helpful, as unable to exclude occult anal fissure. We will have her return in 4 weeks to discuss banding.    PLAN    Anusol BID Benefiber daily, call if not helpful 4 week follow-up to consider banding    20, PhD, [facility]

## 2022-03-08 ENCOUNTER — Telehealth: Payer: Self-pay

## 2022-03-08 NOTE — Telephone Encounter (Signed)
error 

## 2022-03-08 NOTE — Telephone Encounter (Signed)
Pt had phoned and advised that Medicaid didn't cover her Anusol. I phoned to the pharmacy pt had no co-pay. Called pt back and advised her of this.

## 2022-03-12 ENCOUNTER — Other Ambulatory Visit: Payer: Self-pay | Admitting: Gastroenterology

## 2022-03-12 DIAGNOSIS — K5904 Chronic idiopathic constipation: Secondary | ICD-10-CM

## 2022-03-12 MED ORDER — TRULANCE 3 MG PO TABS: 3.0000 mg | ORAL_TABLET | Freq: Every day | ORAL | 3 refills | Status: DC

## 2022-03-12 NOTE — Telephone Encounter (Signed)
Joy Hoffman, I have tried to get a PA on this pt's Linzess 145 mcg but pt has 2 or 3 different insurances and the only ones that does not require a PA is Zel[employer], Trulance, and Lactulose. Can you please send in a alternative for the pt since Vicente Males is out.   Error on my part Vicente Males seen pt last not Dr Abbey Chatters in previous note

## 2022-03-12 NOTE — Telephone Encounter (Signed)
PA had to be done through [employer] with a live person first. Spoke with Bubba Hales and gave all information over the phone then I was given a key for the pt to enter into Cover My Meds. PA done on Cover My Meds just waiting on a response now.

## 2022-03-12 NOTE — Telephone Encounter (Signed)
PA was done on Trulance 3 mg tablets on Cover My Meds. Dx used: K59.04 and K59.01. waiting for a response from Cover My Meds.

## 2022-03-12 NOTE — Telephone Encounter (Signed)
Noted  Will keep an eye for it

## 2022-03-12 NOTE — Telephone Encounter (Signed)
I will send in Rx for Trulance.

## 2022-03-12 NOTE — Telephone Encounter (Signed)
Tried to get a PA with both insurances and it would not allow it. Will advise Dr. Abbey Chatters of this since he was the last one to see this pt. Will send in Trulance since a PA is not required.

## 2022-03-12 NOTE — Telephone Encounter (Signed)
Pt approved for Trulance 3 mg tablets from 03/12/2022 to 03/12/2023. Pt was advised that the Trulance was approved and will give to Manuela Schwartz for scan in pt's chart.

## 2022-03-14 ENCOUNTER — Other Ambulatory Visit (HOSPITAL_COMMUNITY): Payer: Medicaid Other

## 2022-03-19 ENCOUNTER — Ambulatory Visit: Admit: 2022-03-19 | Payer: Medicaid Other | Admitting: Obstetrics & Gynecology

## 2022-03-19 SURGERY — HYSTERECTOMY, TOTAL, LAPAROSCOPIC, WITH BILATERAL SALPINGO-OOPHORECTOMY
Anesthesia: Choice

## 2022-03-26 ENCOUNTER — Encounter: Payer: Medicaid Other | Admitting: Obstetrics & Gynecology

## 2022-04-02 NOTE — Progress Notes (Signed)
   [place] Banding Procedure Note:   Joy Hoffman is a 36 y.o. female presenting today for consideration of hemorrhoid banding. Last colonoscopy 01/25/22 with nonbleeding internal hemorrhoids, four 4 to 6 mm polyps in sigmoid colon.  Advise repeat colonoscopy at age 27.  Advised to follow-up in the office regarding hemorrhoid banding.  Seen 03/07/2022 for possible hemorrhoid banding.  She noted some discomfort with bowel movements mild constipation without any significant rectal bleeding.  She had discomfort with digital rectal exam therefore she was given Anusol and advised to follow-up if this cream was not helpful.  Unable to rule out anal fissure.  Here today after 4 weeks to rediscuss banding.  Patient has since been approved for Trulance for constipation.  Interval history:  Has been taking the Linzess, has been having daily soft bowel movements.  She has not been taking any fiber supplementation.  Only spending about 5 minutes on the toilet at a time.  She reports adequate water intake.  Since her last visit she has been using Anusol cream 2-3 times daily for the last week.  Digital rectal exam performed today without any significant pain.  She denies any bleeding or any recent itching.   The patient presents with symptomatic grade 1 hemorrhoids, unresponsive to maximal medical therapy, requesting rubber band ligation of his/her hemorrhoidal disease. All risks, benefits, and alternative forms of therapy were described and informed consent was obtained.  The decision was made to band the right posterior internal hemorrhoid, and the [place] was used to perform band ligation without complication. Digital anorectal examination was then performed to assure proper positioning of the band, and to adjust the banded tissue as required. The patient was discharged home without pain or other issues. Dietary and behavioral recommendations were given and (if necessary prescriptions were given), along  with follow-up instructions. The patient will return in 2-3 weeks for followup and possible additional banding as required.  No complications were encountered and the patient tolerated the procedure well.   We will plan to perform anoscopy at next visit to further evaluate for any additional hemorrhoids.  Venetia Night, MSN, FNP-BC, AGACNP-BC [facility]

## 2022-04-04 ENCOUNTER — Ambulatory Visit (INDEPENDENT_AMBULATORY_CARE_PROVIDER_SITE_OTHER): Payer: Medicaid Other | Admitting: Gastroenterology

## 2022-04-04 ENCOUNTER — Encounter: Payer: Self-pay | Admitting: Gastroenterology

## 2022-04-04 VITALS — BP 113/75 | HR 68 | Temp 97.7°F | Ht 64.0 in | Wt 268.0 lb

## 2022-04-04 DIAGNOSIS — K5904 Chronic idiopathic constipation: Secondary | ICD-10-CM

## 2022-04-04 DIAGNOSIS — K640 First degree hemorrhoids: Secondary | ICD-10-CM

## 2022-04-04 NOTE — Patient Instructions (Signed)
Continue to avoid straining.   Limit toilet time to 2-3 minutes at the most.   Avoid constipation. Take 2 tablespoons of natural wheat bran, natural oat bran, flax, Benefiber or any over the counter fiber supplement and increase your water intake to 7-8 glasses daily.  Continue taking your Linzess daily, 30 minutes prior to eating breakfast.  Occasionally, you may have more bleeding than usual after the banding procedure. This is often from the untreated hemorrhoids rather than the treated one. Don't be concerned if there is a tablespoon or so of blood. If there is more blood than this, lie flat with your bottom higher than your head and apply an ice pack to the area. If the bleeding does not stop within a half an hour or if you feel faint, have severe pain, chills, fever or difficulty passing urine (very rare) or other problems, you should call us at [phone] or report to the nearest emergency room.call our office at [phone] or go to the emergency room.  Please call me with any concerns!  The procedure you have had should have been relatively painless since the banding of the area involved does not have nerve endings and there is no pain sensation. The rubber band cuts off the blood supply to the hemorrhoid and the band may fall off as soon as 48 hours after the banding (the band may occasionally be seen in the toilet bowl following a bowel movement). You may notice a temporary feeling of fullness in the rectum which should respond adequately to plain Tylenol or Motrin.  I will see you back in follow-up and/or for additional banding.   Venetia Night, MSN, FNP-BC, AGACNP-BC [facility]

## 2022-04-16 NOTE — Progress Notes (Deleted)
   [place] Banding Procedure Note:   Shavonda Wiedman is a 36 y.o. female presenting today for consideration of hemorrhoid banding. Last colonoscopy 01/25/22 with nonbleeding internal hemorrhoids, four 4 to 6 mm polyps in sigmoid colon.  Repeat colonoscopy age 45.  Interval history: Previously banded right posterior internal hemorrhoid with good placement.   (Plan to perform anoscopy)***  The patient presents with symptomatic grade 1 hemorrhoids, unresponsive to maximal medical therapy, requesting rubber band ligation of his/her hemorrhoidal disease. All risks, benefits, and alternative forms of therapy were described and informed consent was obtained.  In the left lateral decubitus position (if anoscopy is performed) anoscopic examination revealed grade *** hemorrhoids in the *** position (s).  The decision was made to band the *** internal hemorrhoid, and the [place] was used to perform band ligation without complication. Digital anorectal examination was then performed to assure proper positioning of the band, and to adjust the banded tissue as required. The patient was discharged home without pain or other issues. Dietary and behavioral recommendations were given and (if necessary prescriptions were given), along with follow-up instructions. The patient will return in *** for followup and possible additional banding as required.  No complications were encountered and the patient tolerated the procedure well.    Venetia Night, MSN, FNP-BC, AGACNP-BC [facility]

## 2022-04-18 ENCOUNTER — Encounter: Payer: BLUE CROSS/BLUE SHIELD | Admitting: Gastroenterology

## 2022-11-27 IMAGING — CT CT ABD-PELV W/ CM
2 of 4 series · 16 of 46 positions shown, 18 images · IV contrast (Omnipaque or Isovue)
Comparison: None Available.

CLINICAL DATA: Abdominal pain.

EXAM:
CT ABDOMEN AND PELVIS WITH CONTRAST
TECHNIQUE: Multidetector CT imaging of the abdomen and pelvis was performed
using the standard protocol following bolus administration of
intravenous contrast.

[Series 2: axial st · axial · 0.81mm/px · z∈[+798,+1233]mm · 13 of 97 slices shown, 15 images]
[im 5/97  soft-tissue]
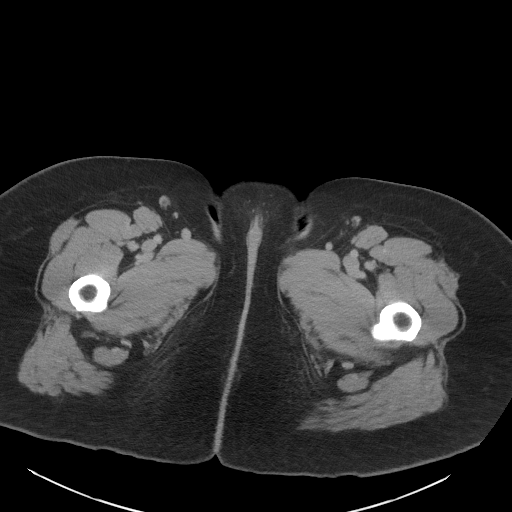
[im 5/97  bone]
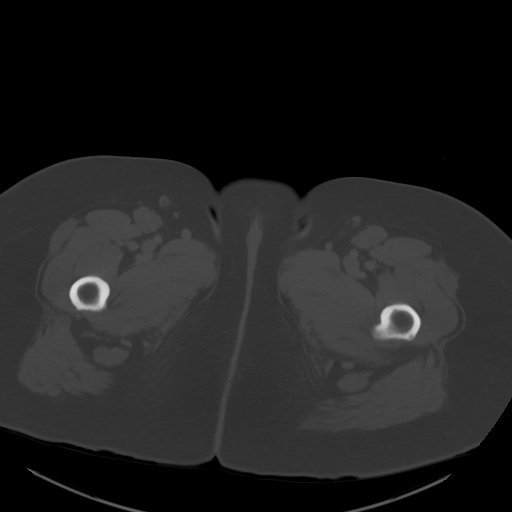
[im 14/97  soft-tissue]
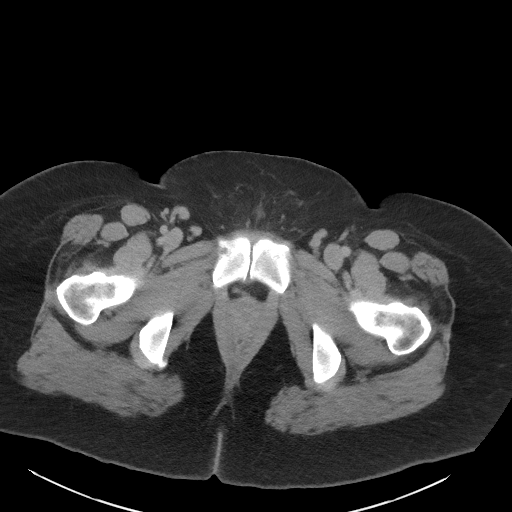
[im 22/97  soft-tissue]
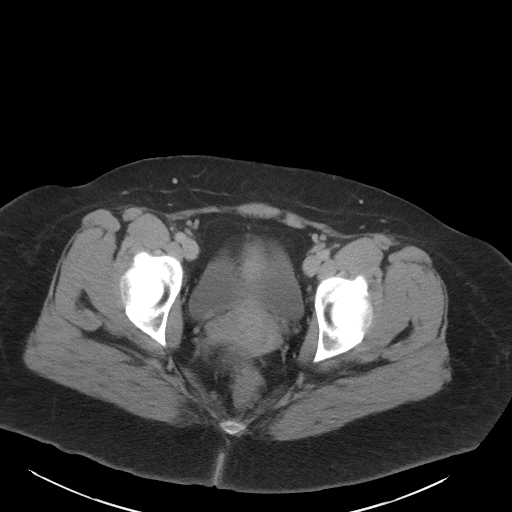
[im 27/97  soft-tissue]
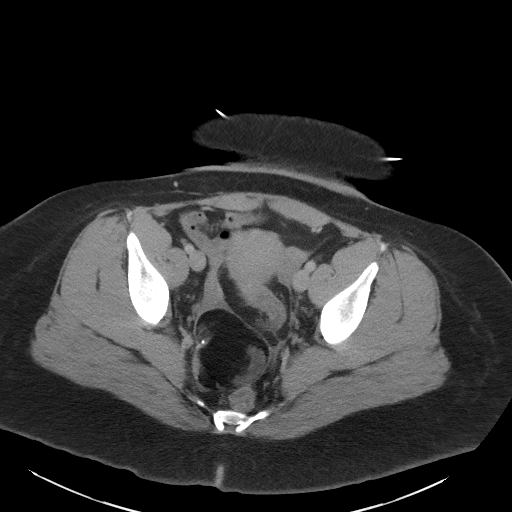
[im 35/97  soft-tissue]
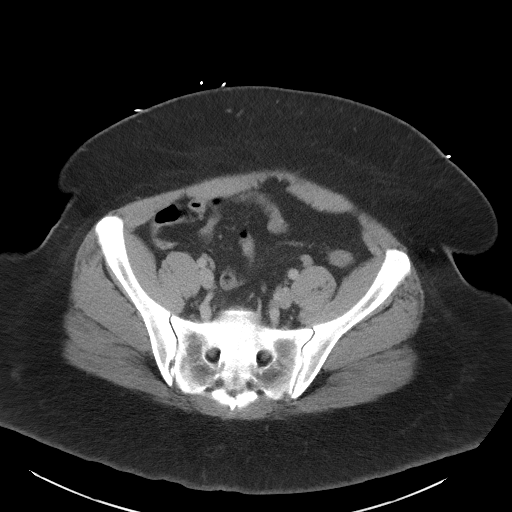
[im 40/97  soft-tissue]
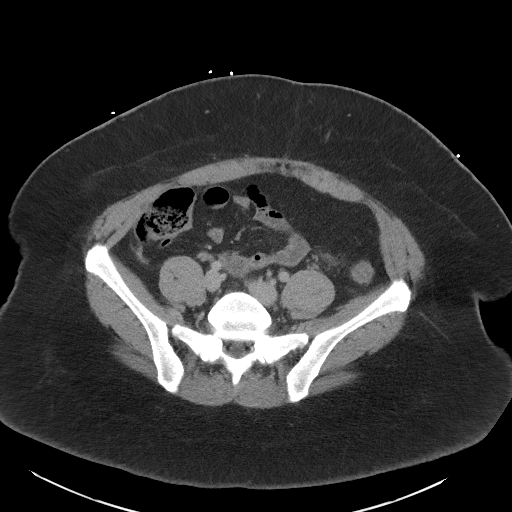
[im 49/97  soft-tissue]
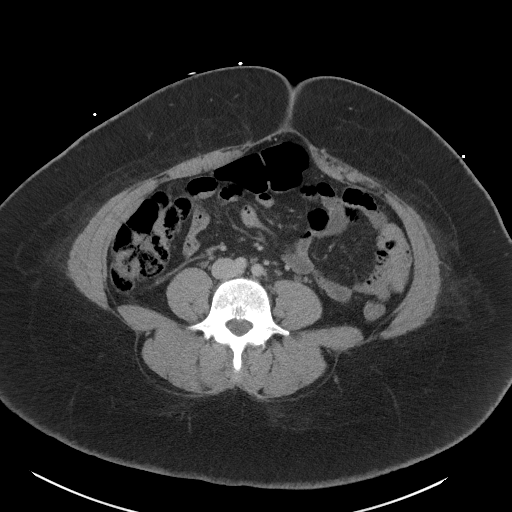
[im 57/97  soft-tissue]
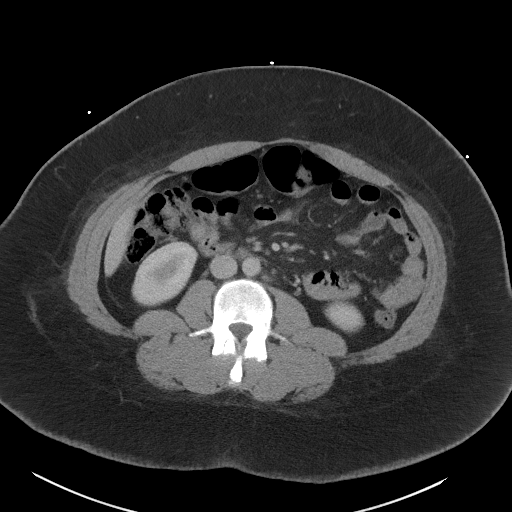
[im 62/97  soft-tissue]
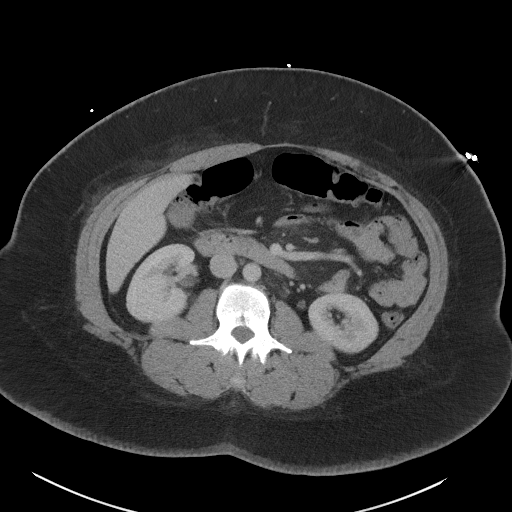
[im 62/97  bone]
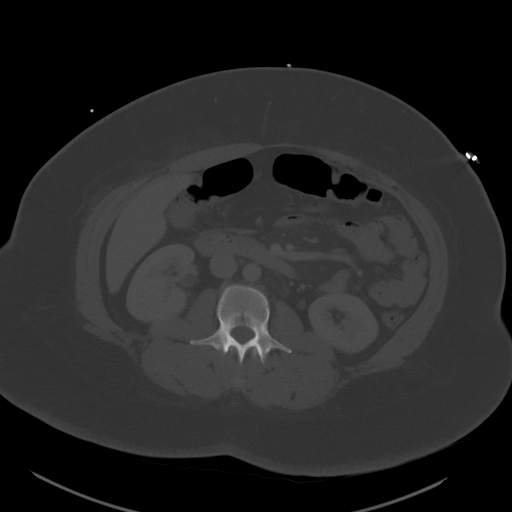
[im 70/97  soft-tissue]
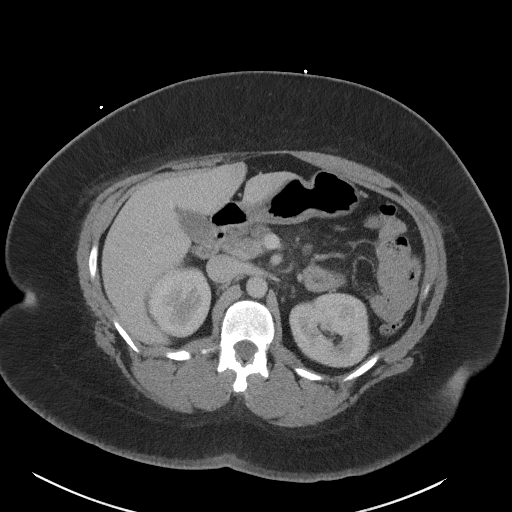
[im 75/97  soft-tissue]
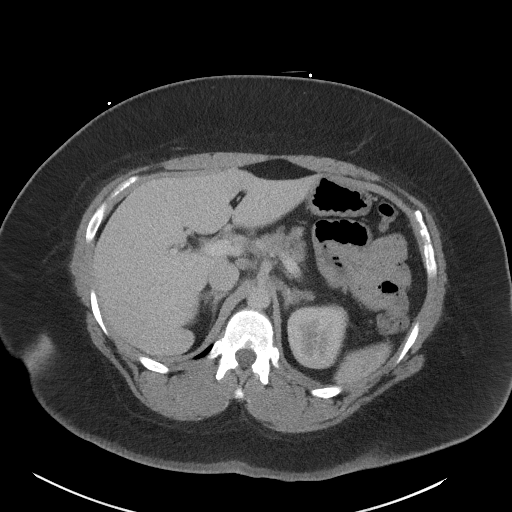
[im 83/97  soft-tissue]
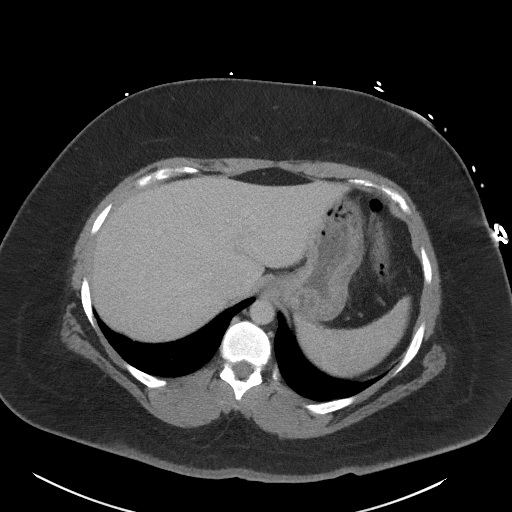
[im 92/97  soft-tissue]
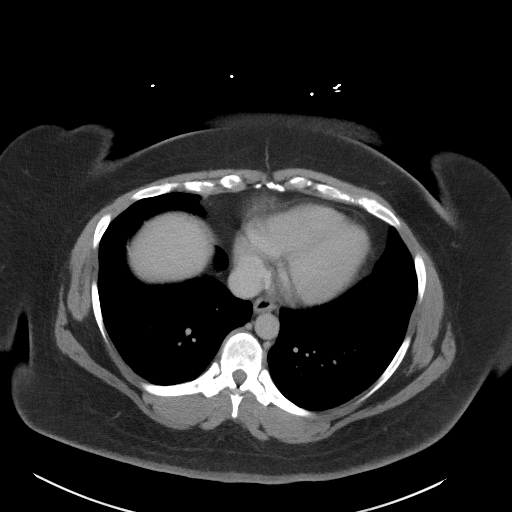

[Series 5: coronal st · coronal · 0.83mm/px · 3 of 130 slices shown]
[im 44/130  soft-tissue]
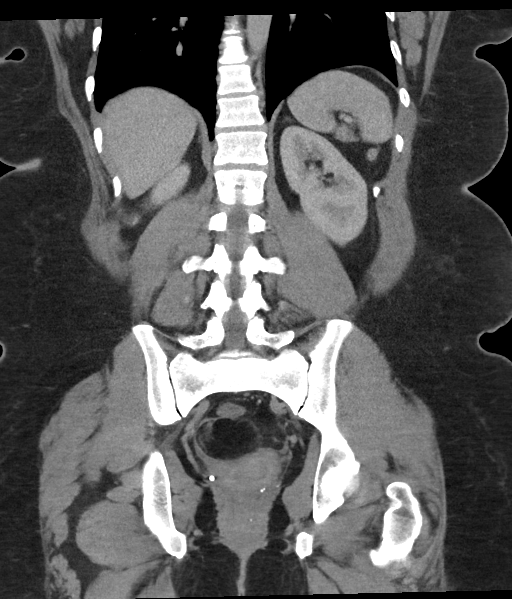
[im 58/130  soft-tissue]
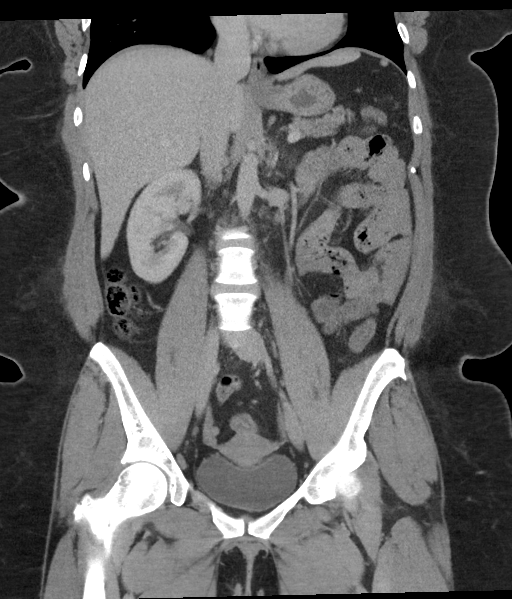
[im 72/130  soft-tissue]
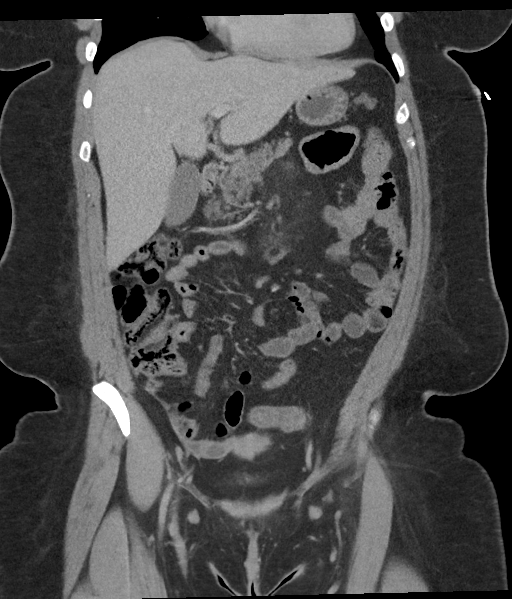

[16 of 46 positions shown; findings below may reference images not displayed]

RADIATION DOSE REDUCTION: This exam was performed according to the
departmental dose-optimization program which includes automated
exposure control, adjustment of the mA and/or kV according to
patient size and/or use of iterative reconstruction technique.

CONTRAST:  100mL OMNIPAQUE IOHEXOL 300 MG/ML  SOLN
FINDINGS: Lower chest: The visualized lung bases are clear.

No intra-abdominal free air.  Small free fluid within the pelvis.

Hepatobiliary: Mild fatty liver. No intrahepatic biliary ductal
dilatation. The gallbladder is unremarkable.

Pancreas: Unremarkable. No pancreatic ductal dilatation or
surrounding inflammatory changes.

Spleen: Normal in size without focal abnormality.

Adrenals/Urinary Tract: The adrenal glands unremarkable. There is a
1.5 cm low attenuating lesion arising from the medial interpolar
right kidney with fat component, most likely an angiomyolipoma. The
left kidney is unremarkable. There is no hydronephrosis on either
side. The visualized ureters and urinary bladder appear
unremarkable.

Stomach/Bowel: There is no bowel obstruction or active inflammation.
The appendix is normal.

Vascular/Lymphatic: Mild aortoiliac atherosclerotic disease. The IVC
is unremarkable. No portal venous gas.

Reproductive: The uterus is anteverted and grossly unremarkable.
There is a 6.5 x 6.0 cm mass with fat and calcium component in the
posterior pelvis most consistent with a teratoma arising from the
right ovary. This can predispose to ovarian torsion. There is a
cm dominant follicle or cyst in the left ovary.

Other: Small fat containing umbilical hernia.

Musculoskeletal: No acute or significant osseous findings.
IMPRESSION: 1. No bowel obstruction. Normal appendix.
2. A 6.5 x 6.0 cm right ovarian teratoma. This can predispose to
ovarian torsion.
3. A 1.5 cm right renal lesion most consistent with an
angiomyolipoma. This can be better characterized with MRI without
and with contrast on a nonemergent/outpatient basis.
4. Mild fatty liver.
5. Aortic Atherosclerosis (03ISP-RR0.0).

## 2022-12-16 ENCOUNTER — Ambulatory Visit (INDEPENDENT_AMBULATORY_CARE_PROVIDER_SITE_OTHER): Payer: Medicaid Other | Admitting: Surgery

## 2022-12-16 ENCOUNTER — Encounter: Payer: Self-pay | Admitting: Surgery

## 2022-12-16 VITALS — BP 123/85 | HR 79 | Temp 98.2°F | Ht 64.0 in | Wt 258.0 lb

## 2022-12-16 DIAGNOSIS — L732 Hidradenitis suppurativa: Secondary | ICD-10-CM | POA: Diagnosis not present

## 2022-12-16 NOTE — Patient Instructions (Addendum)
Follow-up with our office as needed.  Please call and ask to speak with a nurse if you develop questions or concerns.  [place] Audiological scientist.   Wash with Chlorhexadine twice a week in the place of your regular soap or body wash to these areas.  If you have an abscess that you have been trying the above recommended therapy and this is not working, call our office and speak with a nurse so that we may work you in to our schedule.   Hidradenitis Suppurativa Hidradenitis suppurativa is a long-term (chronic) skin disease that starts with blocked sweat glands or hair follicles. Bacteria may grow in these blocked openings of your skin. Hidradenitis suppurativa is like a severe form of acne that develops in areas of your body where acne would be unusual. It is most likely to affect the areas of your body where skin rubs against skin and becomes moist. This includes your: Underarms. Groin. Genital areas. Buttocks. Upper thighs. Breasts. Hidradenitis suppurativa may start out with small pimples. The pimples can develop into deep sores that break open (rupture) and drain pus. Over time your skin may thicken and become scarred. Hidradenitis suppurativa cannot be passed from person to person.  CAUSES  The exact cause of hidradenitis suppurativa is not known. This condition may be due to: Female and female hormones. The condition is rare before and after puberty. An overactive body defense system (immune system). Your immune system may overreact to the blocked hair follicles or sweat glands and cause swelling and pus-filled sores. RISK FACTORS You may have a higher risk of hidradenitis suppurativa if you: Are a woman. Are between ages 49 and 8. Have a family history of hidradenitis suppurativa. Have a personal history of acne. Are overweight. Smoke. Take the drug lithium. SIGNS AND SYMPTOMS  The first signs of an outbreak are usually painful skin bumps that look like pimples. As the condition  progresses: Skin bumps may get bigger and grow deeper into the skin. Bumps under the skin may rupture and drain smelly pus. Skin may become itchy and infected. Skin may thicken and scar. Drainage may continue through tunnels under the skin (fistulas). Walking and moving your arms can become painful. DIAGNOSIS  Your health care provider may diagnose hidradenitis suppurativa based on your medical history and your signs and symptoms. A physical exam will also be done. You may need to see a health care provider who specializes in skin diseases (dermatologist). You may also have tests done to confirm the diagnosis. These can include: Swabbing a sample of pus or drainage from your skin so it can be sent to the lab and tested for infection. Blood tests to check for infection. TREATMENT  The same treatment will not work for everybody with hidradenitis suppurativa. Your treatment will depend on how severe your symptoms are. You may need to try several treatments to find what works best for you. Part of your treatment may include cleaning and bandaging (dressing) your wounds. You may also have to take medicines, such as the following: Antibiotics. Acne medicines. Medicines to block or suppress the immune system. A diabetes medicine (metformin) is sometimes used to treat this condition. For women, birth control pills can sometimes help relieve symptoms. You may need surgery if you have a severe case of hidradenitis suppurativa that does not respond to medicine. Surgery may involve:  Using a laser to clear the skin and remove hair follicles. Opening and draining deep sores. Removing the areas of skin that are diseased  and scarred. HOME CARE INSTRUCTIONS Learn as much as you can about your disease, and work closely with your health care providers. Take medicines only as directed by your health care provider. If you were prescribed an antibiotic medicine, finish it all even if you start to feel  better. If you are overweight, losing weight may be very helpful. Try to reach and maintain a healthy weight. Do not use any tobacco products, including cigarettes, chewing tobacco, or electronic cigarettes. If you need help quitting, ask your health care provider. Do not shave the areas where you get hidradenitis suppurativa. Do not wear deodorant. Wear loose-fitting clothes. Try not to overheat and get sweaty. Take a daily bleach bath as directed by your health care provider. Fill your bathtub halfway with water. Pour in  cup of unscented household bleach. Soak for 5-10 minutes. Cover sore areas with a warm, clean washcloth (compress) for 5-10 minutes. SEEK MEDICAL CARE IF:  You have a flare-up of hidradenitis suppurativa. You have chills or a fever. You are having trouble controlling your symptoms at home.   This information is not intended to replace advice given to you by your health care provider. Make sure you discuss any questions you have with your health care provider.   Document Released: 01/16/2004 Document Revised: 06/24/2014 Document Reviewed: 09/03/2013 Elsevier Interactive Patient Education 2016 Elsevier Inc.   Hidradenitis Suppurativa Hidradenitis suppurativa is a long-term (chronic) skin disease. It is similar to a severe form of acne, but it affects areas of the body where acne would be unusual, especially areas of the body where skin rubs against skin and becomes moist. These include: Underarms. Groin. Genital area. Buttocks. Upper thighs. Breasts. Hidradenitis suppurativa may start out as small lumps or pimples caused by blocked skin pores, sweat glands, or hair follicles. Pimples may develop into deep sores that break open (rupture) and drain pus. Over time, affected areas of skin may thicken and become scarred. This condition is rare and does not spread from person to person (non-contagious). What are the causes? The exact cause of this condition is not known.  It may be related to: Female and female hormones. An overactive disease-fighting system (immune system). The immune system may over-react to blocked hair follicles or sweat glands and cause swelling and pus-filled sores. What increases the risk? You are more likely to develop this condition if you: Are female. Are 73-76 years old. Have a family history of hidradenitis suppurativa. Have a personal history of acne. Are overweight. Smoke. Take the medicine lithium. What are the signs or symptoms? The first symptoms are usually painful bumps in the skin, similar to pimples. The condition may get worse over time (progress), or it may only cause mild symptoms. If the disease progresses, symptoms may include: Skin bumps getting bigger and growing deeper into the skin. Bumps rupturing and draining pus. Itchy, infected skin. Skin getting thicker and scarred. Tunnels under the skin (fistulas) where pus drains from a bump. Pain during daily activities, such as pain during walking if your groin area is affected. Emotional problems, such as stress or depression. This condition may affect your appearance and your ability or willingness to wear certain clothes or do certain activities. How is this diagnosed? This condition is diagnosed by a health care provider who specializes in skin conditions (dermatologist). You may be diagnosed based on: Your symptoms and medical history. A physical exam. Testing a pus sample for infection. Blood tests. How is this treated? Your treatment will depend on how  severe your symptoms are. The same treatment will not work for everybody with this condition. You may need to try several treatments to find what works best for you. Treatment may include: Cleaning and bandaging (dressing) your wounds as needed. Lifestyle changes, such as new skin care routines. Taking medicines, such as: Antibiotics. Acne medicines. Medicines to reduce the activity of the immune system. A  diabetes medicine (metformin). Birth control pills, for women. Steroids to reduce swelling and pain. Working with a mental health care provider, if you experience emotional distress due to this condition. If you have severe symptoms that do not get better with medicine, you may need surgery. Surgery may involve: Using a laser to clear the skin and remove hair follicles. Opening and draining deep sores. Removing the areas of skin that are diseased and scarred. Follow these instructions at home: Medicines  Take over-the-counter and prescription medicines only as told by your health care provider. If you were prescribed antibiotics, take them as told by your health care provider. Do not stop using the antibiotic even if your condition improves. Skin care If you have open wounds, cover them with a clean dressing as told by your health care provider. Keep wounds clean by washing them gently with soap and water when you bathe. Do not shave the areas where you get hidradenitis suppurativa. Wear loose-fitting clothes. Try to avoid getting overheated or sweaty. If you get sweaty or wet, change into clean, dry clothes as soon as you can. To help relieve pain and itchiness, cover sore areas with a warm, clean washcloth (warm compress) for 5-10 minutes as often as needed. Your healthcare provider may recommend an antiperspirant deodorant that may be gentle on your skin. A daily antiseptic wash to cleanse affected areas may be suggested by your healthcare provider. General instructions Learn as much as you can about your disease so that you have an active role in your treatment. Work closely with your health care provider to find treatments that work for you. If you are overweight, work with your health care provider to lose weight as recommended. Do not use any products that contain nicotine or tobacco. These products include cigarettes, chewing tobacco, and vaping devices, such as e-cigarettes. If you  need help quitting, ask your health care provider. If you struggle with living with this condition, talk with your health care provider or work with a mental health care provider as recommended. Keep all follow-up visits. Where to find more information Hidradenitis Suppurativa Foundation, Inc.: www.hs-foundation.org American Academy of Dermatology: InfoExam.si Contact a health care provider if: You have a flare-up of hidradenitis suppurativa. You have a fever or chills. You have trouble controlling your symptoms at home. You have trouble doing your daily activities because of your symptoms. You have trouble dealing with emotional problems related to your condition. Summary Hidradenitis suppurativa is a long-term (chronic) skin disease. It is similar to a severe form of acne, but it affects areas of the body where acne would be unusual. The first symptoms are usually painful bumps in the skin, similar to pimples. The condition may only cause mild symptoms, or it may get worse over time (progress). If you have open wounds, cover them with a clean dressing as told by your health care provider. Keep wounds clean by washing them gently with soap and water when you bathe. Besides skin care, treatment may include medicines, laser treatment, and surgery. This information is not intended to replace advice given to you by your health care  provider. Make sure you discuss any questions you have with your health care provider. Document Revised: 07/25/2021 Document Reviewed: 07/25/2021 Elsevier Patient Education  2024 [employer].

## 2022-12-16 NOTE — Progress Notes (Unsigned)
12/16/2022  Reason for Visit:  Left axillary boil  Requesting Provider:  Alvina Filbert, MD  History of Present Illness: Joy Hoffman is a 37 y.o. female presenting for evaluation of a left axillary boil.  Patient was seen in urgent care about 2 weeks ago with a boil in her left axilla.  This required incision and drainage procedure was given initially a course of Bactrim.  However she was not able to tolerate the Bactrim and later on was given a course of clindamycin for this.  She saw her PCP as well last week at which point her boil had started healing and there was no further drainage and her pain was much improved.  She was referred to general surgery for further management of this.  The patient reports that she has had prior issues in bilateral axilla as well as bilateral groin.  She remembers she has been told about hidradenitis but does not recall if that is truly a diagnosis she carries or not.  Currently, she reports only some mild soreness in the left axilla but denies any drainage, redness, or worsening pain.  Past Medical History: Past Medical History:  Diagnosis Date   Constipation    GERD (gastroesophageal reflux disease)    Morbid obesity with BMI of 45.0-49.9, adult [facility])      Past Surgical History: Past Surgical History:  Procedure Laterality Date   CESAREAN SECTION     COLONOSCOPY WITH PROPOFOL N/A 01/25/2022   Procedure: COLONOSCOPY WITH PROPOFOL;  Surgeon: Lanelle Bal, DO;  Location: [facility];  Service: Endoscopy;  Laterality: N/A;  900 ASA 3   ECTOPIC PREGNANCY SURGERY  2010   POLYPECTOMY  01/25/2022   Procedure: POLYPECTOMY;  Surgeon: Lanelle Bal, DO;  Location: [facility];  Service: Endoscopy;;    Home Medications: Prior to Admission medications   Medication Sig Start Date End Date Taking? Authorizing Provider  albuterol (VENTOLIN HFA) 108 (90 Base) MCG/ACT inhaler Inhale 2 puffs into the lungs every 6 (six) hours as needed. 04/16/22  Yes  [provider]  clindamycin (CLEOCIN) 300 MG capsule Take 300 mg by mouth 2 (two) times daily. 12/09/22  Yes [provider]    Allergies: No Known Allergies  Social History:  reports that she has been smoking cigarettes. She started smoking about 17 years ago. She has been smoking an average of 1 pack per day. She has been exposed to tobacco smoke. She has never used smokeless tobacco. She reports that she does not currently use alcohol. She reports that she does not currently use drugs.   Family History: Family History  Problem Relation Age of Onset   Diabetes Maternal Grandfather    Heart disease Father    Heart disease Mother     Review of Systems: Review of Systems  Constitutional:  Negative for chills and fever.  Respiratory:  Negative for shortness of breath.   Cardiovascular:  Negative for chest pain.  Gastrointestinal:  Negative for abdominal pain, nausea and vomiting.  Skin:        Left axillary boil    Physical Exam BP 123/85   Pulse 79   Temp 98.2 F (36.8 C)   Ht 5\' 4"  (1.626 m)   Wt 258 lb (117 kg)   SpO2 97%   BMI 44.29 kg/m  CONSTITUTIONAL: No acute distress HEENT:  Normocephalic, atraumatic, extraocular motion intact. RESPIRATORY:  Normal respiratory effort without pathologic use of accessory muscles. CARDIOVASCULAR: Regular rhythm and rate MUSCULOSKELETAL:  Normal  muscle strength and tone in all four extremities.  No peripheral edema or cyanosis. SKIN: Left axilla has an area that measures about 3 x 1 cm of some swelling but no fluctuance or induration at the site of prior I&D and boil formation.  There is no further drainage or erythema in this location.  The rest of the left axilla has multiple scars consistent with hidradenitis.  This is found also in the right axilla. NEUROLOGIC:  Motor and sensation is grossly normal.  Cranial nerves are grossly intact. PSYCH:  Alert and oriented to person, place and time. Affect is  normal.  Laboratory Analysis: Labs from 07/16/2022: Sodium 136, potassium 3.8, chloride 104, CO2 21.4, BUN 6, creatinine 0.78.  WBC 12.9, hemoglobin 11.7, hematocrit 34.4, platelets 236.  Imaging: No results found.  Assessment and Plan: This is a 37 y.o. female with recent flareup of left axillary hidradenitis.  - Discussed with patient that likely all the scars that she has some bilateral axilla as well as bilateral inner thigh thighs/groin areas are related to hidradenitis.  Discussed with patient what exactly is hidradenitis and how the sweat glands are affected.  Discussed how these areas can become infected which is what happened last month which required incision and drainage.  Currently the area has healed well without any evidence of further infection.  Discussed with her that she should continue taking her antibiotic until the course is completed.  At this point, no need for any procedures. - Discussed with the patient the overall management of hidradenitis and that we would recommend a referral to dermatology for maintenance regimen for her hidradenitis as well as guidance for different home treatment options with different deodorants, soaps, etc.  Discussed with her that general surgery typically gets more involved during flareups and sometimes plastic surgery needs to get involved if an extensive area needs to be resected. - Patient is in agreement with this plan and all of her questions have been answered.  Follow-up as needed.  I spent 30 minutes dedicated to the care of this patient on the date of this encounter to include pre-visit review of records, face-to-face time with the patient discussing diagnosis and management, and any post-visit coordination of care.   Howie Ill, MD [facility]

## 2022-12-24 ENCOUNTER — Other Ambulatory Visit: Payer: Self-pay

## 2022-12-24 DIAGNOSIS — L732 Hidradenitis suppurativa: Secondary | ICD-10-CM

## 2024-04-30 ENCOUNTER — Ambulatory Visit (HOSPITAL_COMMUNITY): Attending: Internal Medicine | Admitting: Occupational Therapy

## 2024-04-30 ENCOUNTER — Encounter (HOSPITAL_COMMUNITY): Payer: Self-pay | Admitting: Occupational Therapy

## 2024-04-30 DIAGNOSIS — R29898 Other symptoms and signs involving the musculoskeletal system: Secondary | ICD-10-CM | POA: Insufficient documentation

## 2024-04-30 DIAGNOSIS — M25511 Pain in right shoulder: Secondary | ICD-10-CM | POA: Insufficient documentation

## 2024-04-30 DIAGNOSIS — M25611 Stiffness of right shoulder, not elsewhere classified: Secondary | ICD-10-CM | POA: Diagnosis present

## 2024-04-30 NOTE — Therapy (Signed)
 OUTPATIENT OCCUPATIONAL THERAPY ORTHO EVALUATION  Patient Name: Joy Hoffman MRN: [id number] DOB:[birth year], 38 y.o., female Today's Date: 05/03/2024   END OF SESSION:  OT End of Session - 05/03/24 1031     Visit Number 1    Number of Visits 9    Date for Recertification  06/11/24    Authorization Type [place] [employer] [place] Complete    Authorization Time Period Requesting 8 visits    Authorization - Visit Number 0    Authorization - Number of Visits 8    OT Start Time 1019    OT Stop Time 1037    OT Time Calculation (min) 18 min    Activity Tolerance Patient tolerated treatment well    Behavior During Therapy WFL for tasks assessed/performed          Past Medical History:  Diagnosis Date   Constipation    GERD (gastroesophageal reflux disease)    Morbid obesity with BMI of 45.0-49.9, adult [facility])    Past Surgical History:  Procedure Laterality Date   CESAREAN SECTION     COLONOSCOPY WITH PROPOFOL  N/A 01/25/2022   Procedure: COLONOSCOPY WITH PROPOFOL ;  Surgeon: Cindie Carlin POUR, DO;  [facility];  Service: Endoscopy;  Laterality: N/A;  900 ASA 3   ECTOPIC PREGNANCY SURGERY  2010   POLYPECTOMY  01/25/2022   Procedure: POLYPECTOMY;  Surgeon: Cindie Carlin POUR, DO;  [facility];  Service: Endoscopy;;   Patient Active Problem List   Diagnosis Date Noted   Chronic idiopathic constipation 03/07/2022    PCP: Katrinka Aquas, MD REFERRING PROVIDER: Katrinka Aquas, MD  ONSET DATE: July, 2025  REFERRING DIAG: R shoulder pain  THERAPY DIAG:  Right shoulder pain, unspecified chronicity  Shoulder stiffness, right  Other symptoms and signs involving the musculoskeletal system  Rationale for Evaluation and Treatment: Rehabilitation  SUBJECTIVE:   SUBJECTIVE STATEMENT: It's hurting just sitting Pt accompanied by: self  PERTINENT HISTORY: Pt reports history of back 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  PRECAUTIONS: None  WEIGHT BEARING RESTRICTIONS: No  PAIN:  Are you having pain? Yes: NPRS scale: 6/10 Pain location: Shoulder girdle Pain description: Heavy, sharp Aggravating factors: movement Relieving factors: none  FALLS: Has patient fallen in last 6 months? No  PLOF: Independent  PATIENT GOALS: To improve pain  NEXT MD VISIT: As needed  OBJECTIVE:   HAND DOMINANCE: Left  ADLs: Overall ADLs: Pt having difficulty with reaching up, across her body and behind her back. This affects her ability to complete pericare and dressing/bathing. Pt also reports inability to lift pots and pans or cleaning items.   FUNCTIONAL OUTCOME MEASURES: Upper Extremity Functional Scale (UEFS): 44/80   Extreme difficulty/unable (0), Quite a bit of difficulty (1), Moderate difficulty (2), Little difficulty (3), No difficulty (4) Survey date:  04/30/24  Any of your usual work, household or school activities 3  2. Your usual hobbies, recreational/sport activities 3   3. Lifting a bag of groceries to waist level 3   4. Lifting a bag of groceries above your head 1  5. Grooming your hair 2  6. Pushing up on your hands (I.e. from bathtub or chair) 2  7. Preparing food (I.e. peeling/cutting) 3  8. Driving  3  9. Vacuuming, sweeping, or raking 3  10. Dressing  2  11. Doing up buttons 3  12. Using tools/appliances 2  13. Opening doors 2  14. Cleaning  1  15. Tying or lacing shoes 3  16. Sleeping  1  17. Laundering clothes (I.e. washing, ironing, folding) 2  18. Opening a jar 2  19. Throwing a ball 2  20. Carrying a small suitcase with your affected limb.  1  Score total:  44/80     UPPER EXTREMITY ROM:       Assessed in seated, er/IR adducted  Active ROM Right eval  Shoulder flexion 145  Shoulder abduction 143  Shoulder internal rotation 90  Shoulder external rotation 58  (Blank rows = not tested)    UPPER EXTREMITY MMT:     Assessed in seated, er/IR adducted  MMT  Right eval  Shoulder flexion 4+/5 *painful  Shoulder abduction 4/5 *painful  Shoulder internal rotation 5/5  Shoulder external rotation 4/5 *painful  (Blank rows = not tested)  SENSATION: WFL  EDEMA: No swelling  OBSERVATIONS: Pt having moderate fascial restrictions in her biceps, trapezius, and scapular region   TODAY'S TREATMENT:                                                                                                                              DATE:   -Evaluation Only    PATIENT EDUCATION: Education details: Will start next session Person educated: Patient Education method: Explanation, Demonstration, and Handouts Education comprehension: verbalized understanding and returned demonstration  HOME EXERCISE PROGRAM: Will start next session  GOALS: Goals reviewed with patient? Yes   SHORT TERM GOALS: Target date: 06/11/24  Pt will be provided with and educated on HEP to improve mobility in RUE required for use during ADL completion.   Goal status: INITIAL  LONG TERM GOALS: Target date: 06/11/24  Pt will decrease pain in RUE to 3/10 or less to improve ability to sleep for 2+ consecutive hours without waking due to pain.   Goal status: INITIAL  2.  Pt will decrease RUE fascial restrictions to min amounts or less to improve mobility required for functional reaching tasks.   Goal status: INITIAL  3.  Pt will increase RUE A/ROM by 10 degrees to improve ability to use RUE when reaching overhead or behind back during dressing and bathing tasks.   Goal status: INITIAL  4.  Pt will increase RUE strength to 5/5 or greater to improve ability to use RUE when lifting or carrying items during meal preparation/housework/yardwork tasks.   Goal status: INITIAL  5.  Pt will return to highest level of function using RUE as dominant during functional task completion.   Goal status: INITIAL   ASSESSMENT:  CLINICAL IMPRESSION: Patient is a 38 y.o. female who was  seen today for occupational therapy evaluation for R shoulder pain. Pt presents with increased pain and fascial restrictions, decreased ROM, strength, and functional use of the RUE.   PERFORMANCE DEFICITS: in functional skills including in functional skills including ADLs, IADLs, coordination, tone, ROM, strength, pain, fascial restrictions, muscle spasms, and UE functional use.  IMPAIRMENTS: are limiting  patient from ADLs, IADLs, rest and sleep, work, leisure, and social participation.   COMORBIDITIES: may have co-morbidities  that affects occupational performance. Patient will benefit from skilled OT to address above impairments and improve overall function.  MODIFICATION OR ASSISTANCE TO COMPLETE EVALUATION: Min-Moderate modification of tasks or assist with assess necessary to complete an evaluation.  OT OCCUPATIONAL PROFILE AND HISTORY: Detailed assessment: Review of records and additional review of physical, cognitive, psychosocial history related to current functional performance.  CLINICAL DECISION MAKING: Moderate - several treatment options, min-mod task modification necessary  REHAB POTENTIAL: Good  EVALUATION COMPLEXITY: Moderate      PLAN:  OT FREQUENCY: 2x/week  OT DURATION: 4 weeks  PLANNED INTERVENTIONS: 97168 OT Re-evaluation, 97535 self care/ADL training, 02889 therapeutic exercise, 97530 therapeutic activity, 97112 neuromuscular re-education, 97140 manual therapy, 97035 ultrasound, 97010 moist heat, 97032 electrical stimulation (manual), passive range of motion, functional mobility training, energy conservation, coping strategies training, patient/family education, and DME and/or AE instructions  RECOMMENDED OTHER SERVICES: Orthopedic MD  CONSULTED AND AGREED WITH PLAN OF CARE: Patient  PLAN FOR NEXT SESSION: Manual Therapy, P/ROM, A/ROM, Proximal Shoulder Exercises   Valentin Nightingale, OTR/L [facility] [phone] Radley Teston Jillyn Nightingale,  OT 05/03/2024, 10:33 AM

## 2024-04-30 NOTE — Patient Instructions (Signed)

## 2024-05-11 ENCOUNTER — Ambulatory Visit (HOSPITAL_COMMUNITY): Admitting: Occupational Therapy

## 2024-05-11 ENCOUNTER — Encounter (HOSPITAL_COMMUNITY): Payer: Self-pay | Admitting: Occupational Therapy

## 2024-05-11 DIAGNOSIS — M25611 Stiffness of right shoulder, not elsewhere classified: Secondary | ICD-10-CM

## 2024-05-11 DIAGNOSIS — M25511 Pain in right shoulder: Secondary | ICD-10-CM

## 2024-05-11 DIAGNOSIS — R29898 Other symptoms and signs involving the musculoskeletal system: Secondary | ICD-10-CM

## 2024-05-11 NOTE — Therapy (Signed)
 OUTPATIENT OCCUPATIONAL THERAPY ORTHO TREATMENT NOTE  Patient Name: Joy Hoffman MRN: [id number] DOB:1986-03-28, 38 y.o., female Today's Date: 05/11/2024   END OF SESSION:  OT End of Session - 05/11/24 0908     Visit Number 2    Number of Visits 9    Date for Recertification  06/11/24    Authorization Type [place] [employer] [place] Complete    Authorization Time Period Requesting 8 visits    Authorization - Visit Number 1    Authorization - Number of Visits 8    OT Start Time [phone]    OT Stop Time 0908    OT Time Calculation (min) 39 min    Activity Tolerance Patient tolerated treatment well    Behavior During Therapy WFL for tasks assessed/performed          Past Medical History:  Diagnosis Date   Constipation    GERD (gastroesophageal reflux disease)    Morbid obesity with BMI of 45.0-49.9, adult [facility])    Past Surgical History:  Procedure Laterality Date   CESAREAN SECTION     COLONOSCOPY WITH PROPOFOL  N/A 01/25/2022   Procedure: COLONOSCOPY WITH PROPOFOL ;  Surgeon: Cindie Carlin POUR, DO;  [facility];  Service: Endoscopy;  Laterality: N/A;  900 ASA 3   ECTOPIC PREGNANCY SURGERY  2010   POLYPECTOMY  01/25/2022   Procedure: POLYPECTOMY;  Surgeon: Cindie Carlin POUR, DO;  [facility];  Service: Endoscopy;;   Patient Active Problem List   Diagnosis Date Noted   Chronic idiopathic constipation 03/07/2022    PCP: Katrinka Aquas, MD REFERRING PROVIDER: Katrinka Aquas, MD  ONSET DATE: July, 2025  REFERRING DIAG: R shoulder pain  THERAPY DIAG:  Right shoulder pain, unspecified chronicity  Shoulder stiffness, right  Other symptoms and signs involving the musculoskeletal system  Rationale for Evaluation and Treatment: Rehabilitation  SUBJECTIVE:   SUBJECTIVE STATEMENT: 'I'm really sore today Pt accompanied by: self  PERTINENT HISTORY: Pt reports history of back 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  PRECAUTIONS: None  WEIGHT BEARING RESTRICTIONS: No  PAIN:  Are you having pain? Yes: NPRS scale: 8/10 Pain location: Shoulder girdle Pain description: Heavy, sharp Aggravating factors: movement Relieving factors: none  FALLS: Has patient fallen in last 6 months? No  PLOF: Independent  PATIENT GOALS: To improve pain  NEXT MD VISIT: As needed  OBJECTIVE:   HAND DOMINANCE: Left  ADLs: Overall ADLs: Pt having difficulty with reaching up, across her body and behind her back. This affects her ability to complete pericare and dressing/bathing. Pt also reports inability to lift pots and pans or cleaning items.   FUNCTIONAL OUTCOME MEASURES: Upper Extremity Functional Scale (UEFS): 44/80   Extreme difficulty/unable (0), Quite a bit of difficulty (1), Moderate difficulty (2), Little difficulty (3), No difficulty (4) Survey date:  04/30/24  Any of your usual work, household or school activities 3  2. Your usual hobbies, recreational/sport activities 3   3. Lifting a bag of groceries to waist level 3   4. Lifting a bag of groceries above your head 1  5. Grooming your hair 2  6. Pushing up on your hands (I.e. from bathtub or chair) 2  7. Preparing food (I.e. peeling/cutting) 3  8. Driving  3  9. Vacuuming, sweeping, or raking 3  10. Dressing  2  11. Doing up buttons 3  12. Using tools/appliances 2  13. Opening doors 2  14. Cleaning  1  15. Tying or lacing shoes 3  16. Sleeping  1  17. Laundering clothes (I.e. washing, ironing, folding) 2  18. Opening a jar 2  19. Throwing a ball 2  20. Carrying a small suitcase with your affected limb.  1  Score total:  44/80     UPPER EXTREMITY ROM:       Assessed in seated, er/IR adducted  Active ROM Right eval  Shoulder flexion 145  Shoulder abduction 143  Shoulder internal rotation 90  Shoulder external rotation 58  (Blank rows = not tested)    UPPER EXTREMITY MMT:     Assessed in seated, er/IR adducted  MMT  Right eval  Shoulder flexion 4+/5 *painful  Shoulder abduction 4/5 *painful  Shoulder internal rotation 5/5  Shoulder external rotation 4/5 *painful  (Blank rows = not tested)  SENSATION: WFL  EDEMA: No swelling  OBSERVATIONS: Pt having moderate fascial restrictions in her biceps, trapezius, and scapular region   TODAY'S TREATMENT:                                                                                                                              DATE:   05/11/24 -manual therapy: myofascial release and trigger point applied to biceps, trapezius, and scapular region in order to reduce pain and fascial restrictions, as well as improve ROM. -A/ROM: supine - flexion, abduction, protraction, horizontal abduction, er/IR, x10 -Isometrics: flexion, extension , abduction, IR, er, 4x10 -Scapular Strengthening: red band, extension, retraction, rows, x10 -UBE: level 1, 2 mins forwards and backwards   PATIENT EDUCATION: Education details: A/ROM and Isometrics Person educated: Patient Education method: Programmer, Multimedia, Facilities Manager, and Handouts Education comprehension: verbalized understanding and returned demonstration  HOME EXERCISE PROGRAM: 11/25: A/ROM and Isometrics  GOALS: Goals reviewed with patient? Yes   SHORT TERM GOALS: Target date: 06/11/24  Pt will be provided with and educated on HEP to improve mobility in RUE required for use during ADL completion.   Goal status: IN PROGRESS  LONG TERM GOALS: Target date: 06/11/24  Pt will decrease pain in RUE to 3/10 or less to improve ability to sleep for 2+ consecutive hours without waking due to pain.   Goal status: IN PROGRESS  2.  Pt will decrease RUE fascial restrictions to min amounts or less to improve mobility required for functional reaching tasks.   Goal status: IN PROGRESS  3.  Pt will increase RUE A/ROM by 10 degrees to improve ability to use RUE when reaching overhead or behind back during dressing and  bathing tasks.   Goal status: IN PROGRESS  4.  Pt will increase RUE strength to 5/5 or greater to improve ability to use RUE when lifting or carrying items during meal preparation/housework/yardwork tasks.   Goal status: IN PROGRESS  5.  Pt will return to highest level of function using RUE as dominant during functional task completion.   Goal status: IN PROGRESS   ASSESSMENT:  CLINICAL IMPRESSION:  Pt presenting to first OT treatment session with continued pain and discomfort, limiting all mobility. OT addressed her moderate fascial restrictions with manual therapy, then added isometrics this session to assist with stabilization and pain relief. Pt tolerated all exercises fair, with no increase in pain, however reported no improvement in pain either. Verbal and tactile cuing provided for positioning and technique throughout session.   PERFORMANCE DEFICITS: in functional skills including in functional skills including ADLs, IADLs, coordination, tone, ROM, strength, pain, fascial restrictions, muscle spasms, and UE functional use.   PLAN:  OT FREQUENCY: 2x/week  OT DURATION: 4 weeks  PLANNED INTERVENTIONS: 97168 OT Re-evaluation, 97535 self care/ADL training, 02889 therapeutic exercise, 97530 therapeutic activity, 97112 neuromuscular re-education, 97140 manual therapy, 97035 ultrasound, 97010 moist heat, 97032 electrical stimulation (manual), passive range of motion, functional mobility training, energy conservation, coping strategies training, patient/family education, and DME and/or AE instructions  RECOMMENDED OTHER SERVICES: Orthopedic MD  CONSULTED AND AGREED WITH PLAN OF CARE: Patient  PLAN FOR NEXT SESSION: Manual Therapy, P/ROM, A/ROM, Proximal Shoulder Exercises   Valentin Nightingale, OTR/L [facility] Out[facility] Rehab [phone] Adelynne Joerger Jillyn Nightingale, OT 05/11/2024, 10:36 AM

## 2024-05-11 NOTE — Patient Instructions (Signed)
 Repeat all exercises 10-15 times, 1-2 times per day.  1) Shoulder Protraction    Begin with elbows by your side, slowly punch straight out in front of you.      2) Shoulder Flexion  Supine:     Standing:         Begin with arms at your side with thumbs pointed up, slowly raise both arms up and forward towards overhead.               3) Horizontal abduction/adduction  Supine:   Standing:           Begin with arms straight out in front of you, bring out to the side in at T shape. Keep arms straight entire time.                 4) Internal & External Rotation   Supine:     Standing:     Stand with elbows at the side and elbows bent 90 degrees. Move your forearms away from your body, then bring back inward toward the body.     5) Shoulder Abduction  Supine:     Standing:       Lying on your back begin with your arms flat on the table next to your side. Slowly move your arms out to the side so that they go overhead, in a jumping jack or snow angel movement.     Complete the following 2 a day. Hold for 10-15 seconds. Complete 5-6 sets for each.   1) SHOULDER - ISOMETRIC FLEXION  Gently push your fist forward into a wall with your elbow bent.    2) SHOULDER - ISOMETRIC EXTENSION  Gently push your a bent elbow back into a wall.    3) SHOULDER - ISOMETRIC INTERNAL ROTATION   Gently press your hand into a wall using the palm side of your hand.  Maintain a bent elbow the entire time.        4) SHOULDER - ISOMETRIC ADDUCTION  Gently push your elbow into the side of your body.   5) SHOULDER - ISOMETRIC ABDUCTION  Gently push your elbow out to the side into a wall with your elbow bent.

## 2024-05-19 ENCOUNTER — Ambulatory Visit (HOSPITAL_COMMUNITY): Admitting: Occupational Therapy

## 2024-05-21 ENCOUNTER — Ambulatory Visit (HOSPITAL_COMMUNITY): Attending: Internal Medicine | Admitting: Occupational Therapy

## 2024-05-21 ENCOUNTER — Encounter (HOSPITAL_COMMUNITY): Payer: Self-pay | Admitting: Occupational Therapy

## 2024-05-21 DIAGNOSIS — M25511 Pain in right shoulder: Secondary | ICD-10-CM | POA: Insufficient documentation

## 2024-05-21 DIAGNOSIS — M25611 Stiffness of right shoulder, not elsewhere classified: Secondary | ICD-10-CM | POA: Diagnosis present

## 2024-05-21 DIAGNOSIS — R29898 Other symptoms and signs involving the musculoskeletal system: Secondary | ICD-10-CM | POA: Insufficient documentation

## 2024-05-21 NOTE — Therapy (Signed)
 OUTPATIENT OCCUPATIONAL THERAPY ORTHO TREATMENT NOTE  Patient Name: Joy Hoffman MRN: [id number] DOB:03-09-1986, 38 y.o., female Today's Date: 05/21/2024   END OF SESSION:  OT End of Session - 05/21/24 0922     Visit Number 3    Number of Visits 9    Date for Recertification  06/11/24    Authorization Type [place] [employer] Complete    Authorization Time Period 8 visits approved 11/17-12/26/25    Authorization - Visit Number 2    Authorization - Number of Visits 8    OT Start Time [id number]    OT Stop Time 1005    OT Time Calculation (min) 43 min    Activity Tolerance Patient tolerated treatment well    Behavior During Therapy WFL for tasks assessed/performed           Past Medical History:  Diagnosis Date   Constipation    GERD (gastroesophageal reflux disease)    Morbid obesity with BMI of 45.0-49.9, adult [facility])    Past Surgical History:  Procedure Laterality Date   CESAREAN SECTION     COLONOSCOPY WITH PROPOFOL  N/A 01/25/2022   Procedure: COLONOSCOPY WITH PROPOFOL ;  Surgeon: Cindie Carlin POUR, DO;  [facility];  Service: Endoscopy;  Laterality: N/A;  900 ASA 3   ECTOPIC PREGNANCY SURGERY  2010   POLYPECTOMY  01/25/2022   Procedure: POLYPECTOMY;  Surgeon: Cindie Carlin POUR, DO;  [facility];  Service: Endoscopy;;   Patient Active Problem List   Diagnosis Date Noted   Chronic idiopathic constipation 03/07/2022    PCP: Katrinka Aquas, MD REFERRING PROVIDER: Katrinka Aquas, MD  ONSET DATE: July, 2025  REFERRING DIAG: R shoulder pain  THERAPY DIAG:  Right shoulder pain, unspecified chronicity  Shoulder stiffness, right  Other symptoms and signs involving the musculoskeletal system  Rationale for Evaluation and Treatment: Rehabilitation  SUBJECTIVE:   SUBJECTIVE STATEMENT: S: It's a little sore but not bad today.  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  PRECAUTIONS: None  WEIGHT BEARING RESTRICTIONS: No  PAIN:  Are you having pain? Yes: NPRS scale: 2/10 Pain location: Shoulder girdle Pain description: sore Aggravating factors: movement Relieving factors: none  FALLS: Has patient fallen in last 6 months? No  PLOF: Independent  PATIENT GOALS: To improve pain  NEXT MD VISIT: As needed  OBJECTIVE:   HAND DOMINANCE: Left  ADLs: Overall ADLs: Pt having difficulty with reaching up, across her body and behind her back. This affects her ability to complete pericare and dressing/bathing. Pt also reports inability to lift pots and pans or cleaning items.   FUNCTIONAL OUTCOME MEASURES: Upper Extremity Functional Scale (UEFS): 44/80   Extreme difficulty/unable (0), Quite a bit of difficulty (1), Moderate difficulty (2), Little difficulty (3), No difficulty (4) Survey date:  04/30/24  Any of your usual work, household or school activities 3  2. Your usual hobbies, recreational/sport activities 3   3. Lifting a bag of groceries to waist level 3   4. Lifting a bag of groceries above your head 1  5. Grooming your hair 2  6. Pushing up on your hands (I.e. from bathtub or chair) 2  7. Preparing food (I.e. peeling/cutting) 3  8. Driving  3  9. Vacuuming, sweeping, or raking 3  10. Dressing  2  11. Doing up buttons 3  12. Using tools/appliances 2  13. Opening doors 2  14.  Cleaning  1  15. Tying or lacing shoes 3  16. Sleeping  1  17. Laundering clothes (I.e. washing, ironing, folding) 2  18. Opening a jar 2  19. Throwing a ball 2  20. Carrying a small suitcase with your affected limb.  1  Score total:  44/80     UPPER EXTREMITY ROM:       Assessed in seated, er/IR adducted  Active ROM Right eval  Shoulder flexion 145  Shoulder abduction 143  Shoulder internal rotation 90  Shoulder external rotation 58  (Blank rows = not tested)    UPPER EXTREMITY MMT:     Assessed in seated, er/IR adducted  MMT  Right eval  Shoulder flexion 4+/5 *painful  Shoulder abduction 4/5 *painful  Shoulder internal rotation 5/5  Shoulder external rotation 4/5 *painful  (Blank rows = not tested)  SENSATION: WFL  EDEMA: No swelling  OBSERVATIONS: Pt having moderate fascial restrictions in her biceps, trapezius, and scapular region   TODAY'S TREATMENT:                                                                                                                              DATE:  05/21/24 -manual therapy: myofascial release and trigger point applied to biceps, trapezius, and scapular region in order to reduce pain and fascial restrictions, as well as improve ROM. -Strengthening: 2# weight-protraction, flexion, horizontal abduction, er, abduction, 10 reps -Ball on wall: 1' flexion, 1' abduction -X to V arms: 2# weight, 10 reps -Theraband strengthening: red band-protraction, flexion, horizontal abduction, er, IR, abduction, 10 reps -Scapular theraband: red band-row, extension, retraction, 10 reps -UBE: level 2, 3' forward 3' reverse, pace: 10.5  05/11/24 -manual therapy: myofascial release and trigger point applied to biceps, trapezius, and scapular region in order to reduce pain and fascial restrictions, as well as improve ROM. -A/ROM: supine - flexion, abduction, protraction, horizontal abduction, er/IR, x10 -Isometrics: flexion, extension , abduction, IR, er, 4x10 -Scapular Strengthening: red band, extension, retraction, rows, x10 -UBE: level 1, 2 mins forwards and backwards   PATIENT EDUCATION: Education details: theraband strengthening Person educated: Patient Education method: Explanation, Demonstration, and Handouts Education comprehension: verbalized understanding and returned demonstration  HOME EXERCISE PROGRAM: 11/25: A/ROM and Isometrics 12/5: theraband strengthening-red   GOALS: Goals reviewed with patient? Yes   SHORT TERM GOALS: Target date: 06/11/24  Pt will be provided  with and educated on HEP to improve mobility in RUE required for use during ADL completion.   Goal status: IN PROGRESS  LONG TERM GOALS: Target date: 06/11/24  Pt will decrease pain in RUE to 3/10 or less to improve ability to sleep for 2+ consecutive hours without waking due to pain.   Goal status: IN PROGRESS  2.  Pt will decrease RUE fascial restrictions to min amounts or less to improve mobility required for functional reaching tasks.   Goal status: IN PROGRESS  3.  Pt will increase RUE A/ROM by 10 degrees  to improve ability to use RUE when reaching overhead or behind back during dressing and bathing tasks.   Goal status: IN PROGRESS  4.  Pt will increase RUE strength to 5/5 or greater to improve ability to use RUE when lifting or carrying items during meal preparation/housework/yardwork tasks.   Goal status: IN PROGRESS  5.  Pt will return to highest level of function using RUE as dominant during functional task completion.   Goal status: IN PROGRESS   ASSESSMENT:  CLINICAL IMPRESSION: Pt reports low pain levels today, minimal fascial restrictions palpated. Pt completing strengthening in both supine and standing. No increased pain reported. Consistent cuing for position and form as pt tries to use bicep versus shoulder activation for tasks. Pt with full ROM during session. Updated HEP for shoulder strengthening, discussed alternating with current HEP. Verbal cuing for form and technique today.    PERFORMANCE DEFICITS: in functional skills including in functional skills including ADLs, IADLs, coordination, tone, ROM, strength, pain, fascial restrictions, muscle spasms, and UE functional use.   PLAN:  OT FREQUENCY: 2x/week  OT DURATION: 4 weeks  PLANNED INTERVENTIONS: 97168 OT Re-evaluation, 97535 self care/ADL training, 02889 therapeutic exercise, 97530 therapeutic activity, 97112 neuromuscular re-education, 97140 manual therapy, 97035 ultrasound, 97010 moist heat, 97032  electrical stimulation (manual), passive range of motion, functional mobility training, energy conservation, coping strategies training, patient/family education, and DME and/or AE instructions  RECOMMENDED OTHER SERVICES: Orthopedic MD  CONSULTED AND AGREED WITH PLAN OF CARE: Patient  PLAN FOR NEXT SESSION: Manual Therapy as needed, P/ROM, A/ROM, Proximal Shoulder Exercises; shoulder and scapular strengthening and stability work   [employer], OTR/L  [phone] 05/21/2024, 10:07 AM

## 2024-05-21 NOTE — Patient Instructions (Signed)

## 2024-05-26 ENCOUNTER — Encounter (HOSPITAL_COMMUNITY): Payer: Self-pay | Admitting: Occupational Therapy

## 2024-05-26 ENCOUNTER — Ambulatory Visit (HOSPITAL_COMMUNITY): Admitting: Occupational Therapy

## 2024-05-26 DIAGNOSIS — M25511 Pain in right shoulder: Secondary | ICD-10-CM | POA: Diagnosis not present

## 2024-05-26 DIAGNOSIS — R29898 Other symptoms and signs involving the musculoskeletal system: Secondary | ICD-10-CM

## 2024-05-26 DIAGNOSIS — M25611 Stiffness of right shoulder, not elsewhere classified: Secondary | ICD-10-CM

## 2024-05-26 NOTE — Therapy (Signed)
 OUTPATIENT OCCUPATIONAL THERAPY ORTHO TREATMENT NOTE  Patient Name: Joy Hoffman MRN: [id number] DOB:[birth year], 38 y.o., female Today's Date: 05/26/2024   END OF SESSION:  OT End of Session - 05/26/24 0955     Visit Number 4    Number of Visits 9    Date for Recertification  06/11/24    Authorization Type [place] [employer] Complete    Authorization Time Period 8 visits approved 11/17-12/26/25    Authorization - Visit Number 3    Authorization - Number of Visits 8    OT Start Time [phone]    OT Stop Time 1035    OT Time Calculation (min) 42 min    Activity Tolerance Patient tolerated treatment well    Behavior During Therapy WFL for tasks assessed/performed            Past Medical History:  Diagnosis Date   Constipation    GERD (gastroesophageal reflux disease)    Morbid obesity with BMI of 45.0-49.9, adult [facility])    Past Surgical History:  Procedure Laterality Date   CESAREAN SECTION     COLONOSCOPY WITH PROPOFOL  N/A 01/25/2022   Procedure: COLONOSCOPY WITH PROPOFOL ;  Surgeon: Cindie Carlin POUR, DO;  [facility];  Service: Endoscopy;  Laterality: N/A;  900 ASA 3   ECTOPIC PREGNANCY SURGERY  2010   POLYPECTOMY  01/25/2022   Procedure: POLYPECTOMY;  Surgeon: Cindie Carlin POUR, DO;  [facility];  Service: Endoscopy;;   Patient Active Problem List   Diagnosis Date Noted   Chronic idiopathic constipation 03/07/2022    PCP: Katrinka Aquas, MD REFERRING PROVIDER: Katrinka Aquas, MD  ONSET DATE: July, 2025  REFERRING DIAG: R shoulder pain  THERAPY DIAG:  Right shoulder pain, unspecified chronicity  Shoulder stiffness, right  Other symptoms and signs involving the musculoskeletal system  Rationale for Evaluation and Treatment: Rehabilitation  SUBJECTIVE:   SUBJECTIVE STATEMENT: S: It was sore after last session  PERTINENT HISTORY: Pt reports history of back pain and 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  PRECAUTIONS: None  WEIGHT BEARING RESTRICTIONS: No  PAIN:  Are you having pain? Yes: NPRS scale: 4/10 Pain location: Shoulder girdle Pain description: sore Aggravating factors: movement Relieving factors: none  FALLS: Has patient fallen in last 6 months? No  PLOF: Independent  PATIENT GOALS: To improve pain  NEXT MD VISIT: As needed  OBJECTIVE:   HAND DOMINANCE: Left  ADLs: Overall ADLs: Pt having difficulty with reaching up, across her body and behind her back. This affects her ability to complete pericare and dressing/bathing. Pt also reports inability to lift pots and pans or cleaning items.   FUNCTIONAL OUTCOME MEASURES: Upper Extremity Functional Scale (UEFS): 44/80   Extreme difficulty/unable (0), Quite a bit of difficulty (1), Moderate difficulty (2), Little difficulty (3), No difficulty (4) Survey date:  04/30/24  Any of your usual work, household or school activities 3  2. Your usual hobbies, recreational/sport activities 3   3. Lifting a bag of groceries to waist level 3   4. Lifting a bag of groceries above your head 1  5. Grooming your hair 2  6. Pushing up on your hands (I.e. from bathtub or chair) 2  7. Preparing food (I.e. peeling/cutting) 3  8. Driving  3  9. Vacuuming, sweeping, or raking 3  10. Dressing  2  11. Doing up buttons 3  12. Using tools/appliances 2  13. Opening doors 2  14. Cleaning  1  15. Tying or lacing shoes 3  16. Sleeping  1  17. Laundering clothes (I.e. washing, ironing, folding) 2  18. Opening a jar 2  19. Throwing a ball 2  20. Carrying a small suitcase with your affected limb.  1  Score total:  44/80     UPPER EXTREMITY ROM:       Assessed in seated, er/IR adducted  Active ROM Right eval  Shoulder flexion 145  Shoulder abduction 143  Shoulder internal rotation 90  Shoulder external rotation 58  (Blank rows = not tested)    UPPER EXTREMITY MMT:     Assessed in seated, er/IR adducted  MMT  Right eval  Shoulder flexion 4+/5 *painful  Shoulder abduction 4/5 *painful  Shoulder internal rotation 5/5  Shoulder external rotation 4/5 *painful  (Blank rows = not tested)  SENSATION: WFL  EDEMA: No swelling  OBSERVATIONS: Pt having moderate fascial restrictions in her biceps, trapezius, and scapular region   TODAY'S TREATMENT:                                                                                                                              DATE:  05/26/24 -manual therapy: myofascial release and trigger point applied to biceps, trapezius, and scapular region in order to reduce pain and fascial restrictions, as well as improve ROM. -Strengthening: 2# weight-protraction, flexion, horizontal abduction, er, abduction, 10 reps -Proximal shoulder strengthening: supine, 2#-paddles, criss cross, circles each direction, 10 reps -Therapy ball strengthening: standing, green ball-chest press, flexion, overhead press, circles each direction, PNF patterns, 10 reps -ABC writing: 2# weight-shoulder at 90 degrees flexion -X to V arms: 2# weight, 10 reps, 2 breaks -Arms on fire: 4 positions, 15 holds, 2' total with 2 rest breaks -Scapular theraband: green band-row, extension, retraction, 10 reps -Overhead lacing: seated with 2# wrist weight, lacing from top down then reversing -UBE: Level 2, 3' reverse, pace: 10.5  05/21/24 -manual therapy: myofascial release and trigger point applied to biceps, trapezius, and scapular region in order to reduce pain and fascial restrictions, as well as improve ROM. -Strengthening: 2# weight-protraction, flexion, horizontal abduction, er, abduction, 10 reps -Ball on wall: 1' flexion, 1' abduction -X to V arms: 2# weight, 10 reps -Theraband strengthening: red band-protraction, flexion, horizontal abduction, er, IR, abduction, 10 reps -Scapular theraband: red band-row, extension, retraction, 10 reps -UBE: level 2, 3' forward 3' reverse, pace:  10.5  05/11/24 -manual therapy: myofascial release and trigger point applied to biceps, trapezius, and scapular region in order to reduce pain and fascial restrictions, as well as improve ROM. -A/ROM: supine - flexion, abduction, protraction, horizontal abduction, er/IR, x10 -Isometrics: flexion, extension , abduction, IR, er, 4x10 -Scapular Strengthening: red band, extension, retraction, rows, x10 -UBE: level 1, 2 mins forwards and backwards   PATIENT EDUCATION: Education details: reviewed HEP Person educated: Patient Education method: Explanation, Demonstration, and Handouts Education comprehension: verbalized understanding and returned demonstration  HOME EXERCISE PROGRAM: 11/25: A/ROM  and Isometrics 12/5: theraband strengthening-red   GOALS: Goals reviewed with patient? Yes   SHORT TERM GOALS: Target date: 06/11/24  Pt will be provided with and educated on HEP to improve mobility in RUE required for use during ADL completion.   Goal status: IN PROGRESS  LONG TERM GOALS: Target date: 06/11/24  Pt will decrease pain in RUE to 3/10 or less to improve ability to sleep for 2+ consecutive hours without waking due to pain.   Goal status: IN PROGRESS  2.  Pt will decrease RUE fascial restrictions to min amounts or less to improve mobility required for functional reaching tasks.   Goal status: IN PROGRESS  3.  Pt will increase RUE A/ROM by 10 degrees to improve ability to use RUE when reaching overhead or behind back during dressing and bathing tasks.   Goal status: IN PROGRESS  4.  Pt will increase RUE strength to 5/5 or greater to improve ability to use RUE when lifting or carrying items during meal preparation/housework/yardwork tasks.   Goal status: IN PROGRESS  5.  Pt will return to highest level of function using RUE as dominant during functional task completion.   Goal status: IN PROGRESS   ASSESSMENT:  CLINICAL IMPRESSION: Pt reports mild soreness today  from last session and HEP completion, minimal fascial restrictions palpated, manual techniques focused on anterior shoulder region. Pt continued with strengthening in both supine and standing, added therapy ball exercises today. No increased pain reported. Consistent cuing required position and form as pt continues to  use bicep versus shoulder activation for tasks. Pt with full ROM during session. Session activities focusing on form and improving RUE functional use required for ADL tasks.  Verbal cuing for form and technique today.    PERFORMANCE DEFICITS: in functional skills including in functional skills including ADLs, IADLs, coordination, tone, ROM, strength, pain, fascial restrictions, muscle spasms, and UE functional use.   PLAN:  OT FREQUENCY: 2x/week  OT DURATION: 4 weeks  PLANNED INTERVENTIONS: 97168 OT Re-evaluation, 97535 self care/ADL training, 02889 therapeutic exercise, 97530 therapeutic activity, 97112 neuromuscular re-education, 97140 manual therapy, 97035 ultrasound, 97010 moist heat, 97032 electrical stimulation (manual), passive range of motion, functional mobility training, energy conservation, coping strategies training, patient/family education, and DME and/or AE instructions  RECOMMENDED OTHER SERVICES: Orthopedic MD  CONSULTED AND AGREED WITH PLAN OF CARE: Patient  PLAN FOR NEXT SESSION: Manual Therapy as needed, P/ROM, A/ROM, Proximal Shoulder Exercises; shoulder and scapular strengthening and stability work   Sonny Cory, OTR/L  [phone] 05/26/2024, 10:37 AM

## 2024-05-28 ENCOUNTER — Ambulatory Visit (HOSPITAL_COMMUNITY): Admitting: Occupational Therapy

## 2024-06-02 ENCOUNTER — Encounter (HOSPITAL_COMMUNITY): Payer: Self-pay | Admitting: Occupational Therapy

## 2024-06-02 ENCOUNTER — Ambulatory Visit (HOSPITAL_COMMUNITY): Admitting: Occupational Therapy

## 2024-06-02 DIAGNOSIS — M25511 Pain in right shoulder: Secondary | ICD-10-CM | POA: Diagnosis not present

## 2024-06-02 DIAGNOSIS — M25611 Stiffness of right shoulder, not elsewhere classified: Secondary | ICD-10-CM

## 2024-06-02 DIAGNOSIS — R29898 Other symptoms and signs involving the musculoskeletal system: Secondary | ICD-10-CM

## 2024-06-02 NOTE — Therapy (Signed)
 OUTPATIENT OCCUPATIONAL THERAPY ORTHO TREATMENT NOTE  Patient Name: Joy Hoffman MRN: [id number] DOB:09-19-1985, 38 y.o., female Today's Date: 06/02/2024   END OF SESSION:  OT End of Session - 06/02/24 0949     Visit Number 5    Number of Visits 9    Date for Recertification  06/11/24    Authorization Type [place] Medicaid [place] Complete    Authorization Time Period 8 visits approved 11/17-12/26/25    Authorization - Visit Number 4    Authorization - Number of Visits 8    OT Start Time 0906    OT Stop Time 0946    OT Time Calculation (min) 40 min    Activity Tolerance Patient tolerated treatment well    Behavior During Therapy WFL for tasks assessed/performed          Past Medical History:  Diagnosis Date   Constipation    GERD (gastroesophageal reflux disease)    Morbid obesity with BMI of 45.0-49.9, adult [facility])    Past Surgical History:  Procedure Laterality Date   CESAREAN SECTION     COLONOSCOPY WITH PROPOFOL  N/A 01/25/2022   Procedure: COLONOSCOPY WITH PROPOFOL ;  Surgeon: Cindie Carlin POUR, DO;  [facility];  Service: Endoscopy;  Laterality: N/A;  900 ASA 3   ECTOPIC PREGNANCY SURGERY  2010   POLYPECTOMY  01/25/2022   Procedure: POLYPECTOMY;  Surgeon: Cindie Carlin POUR, DO;  Location: [facility];  Service: Endoscopy;;   Patient Active Problem List   Diagnosis Date Noted   Chronic idiopathic constipation 03/07/2022    PCP: Katrinka Aquas, MD REFERRING PROVIDER: Katrinka Aquas, MD  ONSET DATE: July, 2025  REFERRING DIAG: R shoulder pain  THERAPY DIAG:  Right shoulder pain, unspecified chronicity  Other symptoms and signs involving the musculoskeletal system  Shoulder stiffness, right  Rationale for Evaluation and Treatment: Rehabilitation  SUBJECTIVE:   SUBJECTIVE STATEMENT: S: It was sore after last session  PERTINENT HISTORY: Pt reports history of back 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  PRECAUTIONS: None  WEIGHT BEARING RESTRICTIONS: No  PAIN:  Are you having pain? Yes: NPRS scale: 4/10 Pain location: Shoulder girdle Pain description: sore Aggravating factors: movement Relieving factors: none  FALLS: Has patient fallen in last 6 months? No  PLOF: Independent  PATIENT GOALS: To improve pain  NEXT MD VISIT: As needed  OBJECTIVE:   HAND DOMINANCE: Left  ADLs: Overall ADLs: Pt having difficulty with reaching up, across her body and behind her back. This affects her ability to complete pericare and dressing/bathing. Pt also reports inability to lift pots and pans or cleaning items.   FUNCTIONAL OUTCOME MEASURES: Upper Extremity Functional Scale (UEFS): 44/80   Extreme difficulty/unable (0), Quite a bit of difficulty (1), Moderate difficulty (2), Little difficulty (3), No difficulty (4) Survey date:  04/30/24  Any of your usual work, household or school activities 3  2. Your usual hobbies, recreational/sport activities 3   3. Lifting a bag of groceries to waist level 3   4. Lifting a bag of groceries above your head 1  5. Grooming your hair 2  6. Pushing up on your hands (I.e. from bathtub or chair) 2  7. Preparing food (I.e. peeling/cutting) 3  8. Driving  3  9. Vacuuming, sweeping, or raking 3  10. Dressing  2  11. Doing up buttons 3  12. Using tools/appliances 2  13. Opening doors 2  14. Cleaning  1  15. Tying or lacing shoes 3  16. Sleeping  1  17. Laundering clothes (I.e. washing, ironing, folding) 2  18. Opening a jar 2  19. Throwing a ball 2  20. Carrying a small suitcase with your affected limb.  1  Score total:  44/80     UPPER EXTREMITY ROM:       Assessed in seated, er/IR adducted  Active ROM Right eval  Shoulder flexion 145  Shoulder abduction 143  Shoulder internal rotation 90  Shoulder external rotation 58  (Blank rows = not tested)    UPPER EXTREMITY MMT:     Assessed in seated, er/IR adducted  MMT  Right eval  Shoulder flexion 4+/5 *painful  Shoulder abduction 4/5 *painful  Shoulder internal rotation 5/5  Shoulder external rotation 4/5 *painful  (Blank rows = not tested)  SENSATION: WFL  EDEMA: No swelling  OBSERVATIONS: Pt having moderate fascial restrictions in her biceps, trapezius, and scapular region   TODAY'S TREATMENT:                                                                                                                              DATE:  06/02/24 -manual therapy: myofascial release and trigger point applied to biceps, trapezius, and scapular region in order to reduce pain and fascial restrictions, as well as improve ROM. -Strengthening: 2# weight-protraction, flexion, horizontal abduction, er, abduction, x15 -X to V arms, 2#, x15 -Goal post arms, 2#, x15 -Proximal shoulder strengthening: supine, 2#-paddles, criss cross, circles each direction, x12 -ABC writing: 2# weight-shoulder at 90 degrees flexion -PNF strengthening: red band, chest pulls, overhead pulls, er pulls, PNF up, PNF down, x15 -functional reaching: 2# wrist weight, cones from counter to 1st shelf to 2nd shelf in flexion, taking down in abduction  05/26/24 -manual therapy: myofascial release and trigger point applied to biceps, trapezius, and scapular region in order to reduce pain and fascial restrictions, as well as improve ROM. -Strengthening: 2# weight-protraction, flexion, horizontal abduction, er, abduction, 10 reps -Proximal shoulder strengthening: supine, 2#-paddles, criss cross, circles each direction, 10 reps -Therapy ball strengthening: standing, green ball-chest press, flexion, overhead press, circles each direction, PNF patterns, 10 reps -ABC writing: 2# weight-shoulder at 90 degrees flexion -X to V arms: 2# weight, 10 reps, 2 breaks -Arms on fire: 4 positions, 15 holds, 2' total with 2 rest breaks -Scapular theraband: green band-row, extension, retraction, 10 reps -Overhead  lacing: seated with 2# wrist weight, lacing from top down then reversing -UBE: Level 2, 3' reverse, pace: 10.5  05/21/24 -manual therapy: myofascial release and trigger point applied to biceps, trapezius, and scapular region in order to reduce pain and fascial restrictions, as well as improve ROM. -Strengthening: 2# weight-protraction, flexion, horizontal abduction, er, abduction, 10 reps -Ball on wall: 1' flexion, 1' abduction -X to V arms: 2# weight, 10 reps -Theraband strengthening: red band-protraction, flexion, horizontal abduction, er, IR, abduction, 10 reps -Scapular theraband: red band-row, extension, retraction, 10 reps -UBE:  level 2, 3' forward 3' reverse, pace: 10.5   PATIENT EDUCATION: Education details: reviewed HEP Person educated: Patient Education method: Explanation, Demonstration, and Handouts Education comprehension: verbalized understanding and returned demonstration  HOME EXERCISE PROGRAM: 11/25: A/ROM and Isometrics 12/5: theraband strengthening-red   GOALS: Goals reviewed with patient? Yes   SHORT TERM GOALS: Target date: 06/11/24  Pt will be provided with and educated on HEP to improve mobility in RUE required for use during ADL completion.   Goal status: IN PROGRESS  LONG TERM GOALS: Target date: 06/11/24  Pt will decrease pain in RUE to 3/10 or less to improve ability to sleep for 2+ consecutive hours without waking due to pain.   Goal status: IN PROGRESS  2.  Pt will decrease RUE fascial restrictions to min amounts or less to improve mobility required for functional reaching tasks.   Goal status: IN PROGRESS  3.  Pt will increase RUE A/ROM by 10 degrees to improve ability to use RUE when reaching overhead or behind back during dressing and bathing tasks.   Goal status: IN PROGRESS  4.  Pt will increase RUE strength to 5/5 or greater to improve ability to use RUE when lifting or carrying items during meal preparation/housework/yardwork tasks.    Goal status: IN PROGRESS  5.  Pt will return to highest level of function using RUE as dominant during functional task completion.   Goal status: IN PROGRESS   ASSESSMENT:  CLINICAL IMPRESSION: This session pt reports overall feeling well with no pain at the start of the session. She worked on print production planner and ROM this session to continue improving her functional mobility. OT added functional reaching with light weights to improve her stability with overhead reaching. Pt required no rest breaks this session and demonstrated good technique and mobility. OT providing verbal and visual cuing for positioning and technique throughout session.    PERFORMANCE DEFICITS: in functional skills including in functional skills including ADLs, IADLs, coordination, tone, ROM, strength, pain, fascial restrictions, muscle spasms, and UE functional use.   PLAN:  OT FREQUENCY: 2x/week  OT DURATION: 4 weeks  PLANNED INTERVENTIONS: 97168 OT Re-evaluation, 97535 self care/ADL training, 02889 therapeutic exercise, 97530 therapeutic activity, 97112 neuromuscular re-education, 97140 manual therapy, 97035 ultrasound, 97010 moist heat, 97032 electrical stimulation (manual), passive range of motion, functional mobility training, energy conservation, coping strategies training, patient/family education, and DME and/or AE instructions  RECOMMENDED OTHER SERVICES: Orthopedic MD  CONSULTED AND AGREED WITH PLAN OF CARE: Patient  PLAN FOR NEXT SESSION: Manual Therapy as needed, P/ROM, A/ROM, Proximal Shoulder Exercises; shoulder and scapular strengthening and stability work   Valentin Nightingale, OTR/L [phone] 06/02/2024, 9:50 AM

## 2024-06-02 NOTE — Patient Instructions (Signed)

## 2024-06-04 ENCOUNTER — Ambulatory Visit (HOSPITAL_COMMUNITY): Admitting: Occupational Therapy

## 2024-06-04 ENCOUNTER — Encounter (HOSPITAL_COMMUNITY): Payer: Self-pay | Admitting: Occupational Therapy

## 2024-06-04 DIAGNOSIS — R29898 Other symptoms and signs involving the musculoskeletal system: Secondary | ICD-10-CM

## 2024-06-04 DIAGNOSIS — M25511 Pain in right shoulder: Secondary | ICD-10-CM | POA: Diagnosis not present

## 2024-06-04 DIAGNOSIS — M25611 Stiffness of right shoulder, not elsewhere classified: Secondary | ICD-10-CM

## 2024-06-04 NOTE — Patient Instructions (Signed)
 Complete _______ repetitions each. Complete _______ time a day.  Wall taps with theraband  With a looped elastic band around forearms/wrists and arms at a 90 angle pressed against the wall. Keeping elbows on the wall, tap right arm out to the right. Hold for 1 second. Return right arm back to neutral. Repeat with left arm.    Wall V slides with Theraband  Place a band loop around hands/forearms and face a wall. Extend both arms diagonally into a V shape on the wall. Hold this stretch for specified amount of time. Lower arms slowly back into neutral position. Repeat.       scap clocks  Tie a loop with a theraband and place around your wrists.  Stand with a wall in front of you.  Picture a clock in front of you.  Place both palms on the wall, arms straight.  While keeping the left/right hand planted, use the right/left hand to pull away and tap each number (1, 3, 5- right OR 11,9,7 left), coming back to center each time.

## 2024-06-04 NOTE — Therapy (Signed)
 " OUTPATIENT OCCUPATIONAL THERAPY ORTHO TREATMENT NOTE  Patient Name: Joy Hoffman MRN: [id number] DOB:[birth year], 38 y.o., female Today's Date: 06/04/2024   END OF SESSION:  OT End of Session - 06/04/24 1029     Visit Number 6    Number of Visits 9    Date for Recertification  06/11/24    Authorization Type [place] Medicaid [place] Complete    Authorization Time Period 8 visits approved 11/17-12/26/25    Authorization - Visit Number 5    Authorization - Number of Visits 8    OT Start Time 0945    OT Stop Time 1030    OT Time Calculation (min) 45 min    Activity Tolerance Patient tolerated treatment well    Behavior During Therapy WFL for tasks assessed/performed           Past Medical History:  Diagnosis Date   Constipation    GERD (gastroesophageal reflux disease)    Morbid obesity with BMI of 45.0-49.9, adult [facility])    Past Surgical History:  Procedure Laterality Date   CESAREAN SECTION     COLONOSCOPY WITH PROPOFOL  N/A 01/25/2022   Procedure: COLONOSCOPY WITH PROPOFOL ;  Surgeon: Cindie Carlin POUR, DO;  [facility];  Service: Endoscopy;  Laterality: N/A;  900 ASA 3   ECTOPIC PREGNANCY SURGERY  2010   POLYPECTOMY  01/25/2022   Procedure: POLYPECTOMY;  Surgeon: Cindie Carlin POUR, DO;  [facility];  Service: Endoscopy;;   Patient Active Problem List   Diagnosis Date Noted   Chronic idiopathic constipation 03/07/2022    PCP: Katrinka Aquas, MD REFERRING PROVIDER: Katrinka Aquas, MD  ONSET DATE: July, 2025  REFERRING DIAG: R shoulder pain  THERAPY DIAG:  Right shoulder pain, unspecified chronicity  Other symptoms and signs involving the musculoskeletal system  Shoulder stiffness, right  Rationale for Evaluation and Treatment: Rehabilitation  SUBJECTIVE:   SUBJECTIVE STATEMENT: S: What do I do next since therapy is not helping much  PERTINENT HISTORY: Pt reports history of back pain 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  PRECAUTIONS: None  WEIGHT BEARING RESTRICTIONS: No  PAIN:  Are you having pain? Yes: NPRS scale: 4/10 Pain location: Shoulder girdle Pain description: sore Aggravating factors: movement Relieving factors: none  FALLS: Has patient fallen in last 6 months? No  PLOF: Independent  PATIENT GOALS: To improve pain  NEXT MD VISIT: As needed  OBJECTIVE:   HAND DOMINANCE: Left  ADLs: Overall ADLs: Pt having difficulty with reaching up, across her body and behind her back. This affects her ability to complete pericare and dressing/bathing. Pt also reports inability to lift pots and pans or cleaning items.   FUNCTIONAL OUTCOME MEASURES: Upper Extremity Functional Scale (UEFS): 44/80   Extreme difficulty/unable (0), Quite a bit of difficulty (1), Moderate difficulty (2), Little difficulty (3), No difficulty (4) Survey date:  04/30/24  Any of your usual work, household or school activities 3  2. Your usual hobbies, recreational/sport activities 3   3. Lifting a bag of groceries to waist level 3   4. Lifting a bag of groceries above your head 1  5. Grooming your hair 2  6. Pushing up on your hands (I.e. from bathtub or chair) 2  7. Preparing food (I.e. peeling/cutting) 3  8. Driving  3  9. Vacuuming, sweeping, or raking 3  10. Dressing  2  11. Doing up buttons 3  12. Using tools/appliances 2  13. Opening  doors 2  14. Cleaning  1  15. Tying or lacing shoes 3  16. Sleeping  1  17. Laundering clothes (I.e. washing, ironing, folding) 2  18. Opening a jar 2  19. Throwing a ball 2  20. Carrying a small suitcase with your affected limb.  1  Score total:  44/80     UPPER EXTREMITY ROM:       Assessed in seated, er/IR adducted  Active ROM Right eval  Shoulder flexion 145  Shoulder abduction 143  Shoulder internal rotation 90  Shoulder external rotation 58  (Blank rows = not tested)    UPPER EXTREMITY MMT:     Assessed in seated, er/IR  adducted  MMT Right eval  Shoulder flexion 4+/5 *painful  Shoulder abduction 4/5 *painful  Shoulder internal rotation 5/5  Shoulder external rotation 4/5 *painful  (Blank rows = not tested)  SENSATION: WFL  EDEMA: No swelling  OBSERVATIONS: Pt having moderate fascial restrictions in her biceps, trapezius, and scapular region   TODAY'S TREATMENT:                                                                                                                              DATE:   06/04/24 -manual therapy: myofascial release and trigger point applied to biceps, trapezius, and scapular region in order to reduce pain and fascial restrictions, as well as improve ROM. -UE strength: standing with 3 pound weights, shoulder flexion, abduction, shoulder press, horizontal abduction punches, curls, protraction, retraction 10x each  -Ball rolls: rolling ball up and down wall into flexion 3 minutes (1 min and a half each 2x)  -Banded loop exercises: arms up wall into v, wall taps, clock slides 10x each  -UBE: Level 2, 3' reverse, pace: 5.5  06/02/24 -manual therapy: myofascial release and trigger point applied to biceps, trapezius, and scapular region in order to reduce pain and fascial restrictions, as well as improve ROM. -Strengthening: 2# weight-protraction, flexion, horizontal abduction, er, abduction, x15 -X to V arms, 2#, x15 -Goal post arms, 2#, x15 -Proximal shoulder strengthening: supine, 2#-paddles, criss cross, circles each direction, x12 -ABC writing: 2# weight-shoulder at 90 degrees flexion -PNF strengthening: red band, chest pulls, overhead pulls, er pulls, PNF up, PNF down, x15 -functional reaching: 2# wrist weight, cones from counter to 1st shelf to 2nd shelf in flexion, taking down in abduction  05/26/24 -manual therapy: myofascial release and trigger point applied to biceps, trapezius, and scapular region in order to reduce pain and fascial restrictions, as well as improve  ROM. -Strengthening: 2# weight-protraction, flexion, horizontal abduction, er, abduction, 10 reps -Proximal shoulder strengthening: supine, 2#-paddles, criss cross, circles each direction, 10 reps -Therapy ball strengthening: standing, green ball-chest press, flexion, overhead press, circles each direction, PNF patterns, 10 reps -ABC writing: 2# weight-shoulder at 90 degrees flexion -X to V arms: 2# weight, 10 reps, 2 breaks -Arms on fire: 4 positions, 15 holds, 2' total with 2 rest breaks -Scapular theraband:  green band-row, extension, retraction, 10 reps -Overhead lacing: seated with 2# wrist weight, lacing from top down then reversing -UBE: Level 2, 3' reverse, pace: 10.5    PATIENT EDUCATION: Education details: reviewed HEP Person educated: Patient Education method: Explanation, Demonstration, and Handouts Education comprehension: verbalized understanding and returned demonstration  HOME EXERCISE PROGRAM: 11/25: A/ROM and Isometrics 12/5: theraband strengthening-red 12/19: theraband loop exercises    GOALS: Goals reviewed with patient? Yes   SHORT TERM GOALS: Target date: 06/11/24  Pt will be provided with and educated on HEP to improve mobility in RUE required for use during ADL completion.   Goal status: IN PROGRESS  LONG TERM GOALS: Target date: 06/11/24  Pt will decrease pain in RUE to 3/10 or less to improve ability to sleep for 2+ consecutive hours without waking due to pain.   Goal status: IN PROGRESS  2.  Pt will decrease RUE fascial restrictions to min amounts or less to improve mobility required for functional reaching tasks.   Goal status: IN PROGRESS  3.  Pt will increase RUE A/ROM by 10 degrees to improve ability to use RUE when reaching overhead or behind back during dressing and bathing tasks.   Goal status: IN PROGRESS  4.  Pt will increase RUE strength to 5/5 or greater to improve ability to use RUE when lifting or carrying items during meal  preparation/housework/yardwork tasks.   Goal status: IN PROGRESS  5.  Pt will return to highest level of function using RUE as dominant during functional task completion.   Goal status: IN PROGRESS   ASSESSMENT:  CLINICAL IMPRESSION: Pr reports that her pain continues to remain the same. Continued with upper extremity strengthening. Verbal cueing for form throughout exercises.Pt asking what the plan will be after he last visit next week. Discussed that she will need to see an orthopedic doctor and get an MRI to rule out any tears. Added banded scapular loop exercises to home program.    PERFORMANCE DEFICITS: in functional skills including in functional skills including ADLs, IADLs, coordination, tone, ROM, strength, pain, fascial restrictions, muscle spasms, and UE functional use.   PLAN:  OT FREQUENCY: 2x/week  OT DURATION: 4 weeks  PLANNED INTERVENTIONS: 97168 OT Re-evaluation, 97535 self care/ADL training, 02889 therapeutic exercise, 97530 therapeutic activity, 97112 neuromuscular re-education, 97140 manual therapy, 97035 ultrasound, 97010 moist heat, 97032 electrical stimulation (manual), passive range of motion, functional mobility training, energy conservation, coping strategies training, patient/family education, and DME and/or AE instructions  RECOMMENDED OTHER SERVICES: Orthopedic MD  CONSULTED AND AGREED WITH PLAN OF CARE: Patient  PLAN FOR NEXT SESSION: Manual Therapy as needed, P/ROM, A/ROM, Proximal Shoulder Exercises; shoulder and scapular strengthening and stability work   Chiquita Sermon, OTR/L [phone] 06/04/2024, 10:30 AM   "

## 2024-06-08 ENCOUNTER — Encounter (HOSPITAL_COMMUNITY): Payer: Self-pay | Admitting: Occupational Therapy

## 2024-06-08 ENCOUNTER — Ambulatory Visit (HOSPITAL_COMMUNITY): Admitting: Occupational Therapy

## 2024-06-08 DIAGNOSIS — M25511 Pain in right shoulder: Secondary | ICD-10-CM | POA: Diagnosis not present

## 2024-06-08 DIAGNOSIS — M25611 Stiffness of right shoulder, not elsewhere classified: Secondary | ICD-10-CM

## 2024-06-08 DIAGNOSIS — R29898 Other symptoms and signs involving the musculoskeletal system: Secondary | ICD-10-CM

## 2024-06-08 NOTE — Therapy (Signed)
 " OUTPATIENT OCCUPATIONAL THERAPY ORTHO TREATMENT NOTE DISCHARGE NOTE  Patient Name: Joy Hoffman MRN: [id number] DOB:May 01, 1986, 38 y.o., female Today's Date: 06/08/2024  OCCUPATIONAL THERAPY DISCHARGE SUMMARY  Visits from Start of Care: 10  Current functional level related to goals / functional outcomes: Pt has met all OT goals.    Remaining deficits: Pt has no remaining deficits.   Education / Equipment: Pt has been provided a comprehensive HEP.    Plan: Patient agrees to discharge as all OT goals have been met    END OF SESSION:  OT End of Session - 06/08/24 1121     Visit Number 7    Number of Visits 9    Date for Recertification  06/11/24    Authorization Type [place] [employer] [place] Complete    Authorization Time Period 8 visits approved 11/17-12/26/25    Authorization - Visit Number 6    Authorization - Number of Visits 8    OT Start Time 1048    OT Stop Time 1113    OT Time Calculation (min) 25 min    Activity Tolerance Patient tolerated treatment well    Behavior During Therapy WFL for tasks assessed/performed          Past Medical History:  Diagnosis Date   Constipation    GERD (gastroesophageal reflux disease)    Morbid obesity with BMI of 45.0-49.9, adult [facility])    Past Surgical History:  Procedure Laterality Date   CESAREAN SECTION     COLONOSCOPY WITH PROPOFOL  N/A 01/25/2022   Procedure: COLONOSCOPY WITH PROPOFOL ;  Surgeon: Cindie Carlin POUR, DO;  Location: [facility];  Service: Endoscopy;  Laterality: N/A;  900 ASA 3   ECTOPIC PREGNANCY SURGERY  2010   POLYPECTOMY  01/25/2022   Procedure: POLYPECTOMY;  Surgeon: Cindie Carlin POUR, DO;  Location: [facility];  Service: Endoscopy;;   Patient Active Problem List   Diagnosis Date Noted   Chronic idiopathic constipation 03/07/2022    PCP: Katrinka Aquas, MD REFERRING PROVIDER: Katrinka Aquas, MD  ONSET DATE: July, 2025  REFERRING DIAG: R shoulder pain  THERAPY DIAG:  Right shoulder pain,  unspecified chronicity  Shoulder stiffness, right  Other symptoms and signs involving the musculoskeletal system  Rationale for Evaluation and Treatment: Rehabilitation  SUBJECTIVE:   SUBJECTIVE STATEMENT: S: I feel much much better now  PERTINENT HISTORY: Pt reports history of back pain and shoulder pain started in July 2025, 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  PRECAUTIONS: None  WEIGHT BEARING RESTRICTIONS: No  PAIN:  Are you having pain? Yes: NPRS scale: 4/10 Pain location: Shoulder girdle Pain description: sore Aggravating factors: movement Relieving factors: none  FALLS: Has patient fallen in last 6 months? No  PLOF: Independent  PATIENT GOALS: To improve pain  NEXT MD VISIT: As needed  OBJECTIVE:   HAND DOMINANCE: Left  ADLs: Overall ADLs: Pt having difficulty with reaching up, across her body and behind her back. This affects her ability to complete pericare and dressing/bathing. Pt also reports inability to lift pots and pans or cleaning items.   FUNCTIONAL OUTCOME MEASURES: Upper Extremity Functional Scale (UEFS): 44/80   Extreme difficulty/unable (0), Quite a bit of difficulty (1), Moderate difficulty (2), Little difficulty (3), No difficulty (4) Survey date:  04/30/24 06/08/24  Any of your usual work, household or school activities 3 4  2. Your usual hobbies, recreational/sport activities 3 4   3. Lifting a bag of groceries to waist level 3 4   4.  Lifting a bag of groceries above your head 1 4  5. Grooming your hair 2 4  6. Pushing up on your hands (I.e. from bathtub or chair) 2 4  7. Preparing food (I.e. peeling/cutting) 3 4  8. Driving  3 4  9. Vacuuming, sweeping, or raking 3 4  10. Dressing  2 4  11. Doing up buttons 3 4  12. Using tools/appliances 2 4  13. Opening doors 2 4  14. Cleaning  1 4  15. Tying or lacing shoes 3 4  16. Sleeping  1 4  17. Laundering clothes (I.e. washing, ironing, folding) 2 4  18. Opening a jar 2 4   19. Throwing a ball 2 4  20. Carrying a small suitcase with your affected limb.  1 4  Score total:  44/80 80/80     UPPER EXTREMITY ROM:       Assessed in seated, er/IR adducted  Active ROM Right eval Right 06/08/24  Shoulder flexion 145 160  Shoulder abduction 143 152  Shoulder internal rotation 90 90  Shoulder external rotation 58 82  (Blank rows = not tested)    UPPER EXTREMITY MMT:     Assessed in seated, er/IR adducted  MMT Right eval Right 06/08/24  Shoulder flexion 4+/5 *painful 5/5  Shoulder abduction 4/5 *painful 5/5  Shoulder internal rotation 5/5 5/5  Shoulder external rotation 4/5 *painful 5/5  (Blank rows = not tested)  SENSATION: WFL  EDEMA: No swelling  OBSERVATIONS: Pt having moderate fascial restrictions in her biceps, trapezius, and scapular region   TODAY'S TREATMENT:                                                                                                                              DATE:   06/08/24 -manual therapy: myofascial release and trigger point applied to biceps, trapezius, and scapular region in order to reduce pain and fascial restrictions, as well as improve ROM. -P/ROM: supine - flexion, abduction, er//IR, x3 -A/ROM: seated - protraction, flexion, horizontal abduction, er, abduction, x15 -Reviewed HEP  06/04/24 -manual therapy: myofascial release and trigger point applied to biceps, trapezius, and scapular region in order to reduce pain and fascial restrictions, as well as improve ROM. -UE strength: standing with 3 pound weights, shoulder flexion, abduction, shoulder press, horizontal abduction punches, curls, protraction, retraction 10x each  -Ball rolls: rolling ball up and down wall into flexion 3 minutes (1 min and a half each 2x)  -Banded loop exercises: arms up wall into v, wall taps, clock slides 10x each  -UBE: Level 2, 3' reverse, pace: 5.5  06/02/24 -manual therapy: myofascial release and trigger point  applied to biceps, trapezius, and scapular region in order to reduce pain and fascial restrictions, as well as improve ROM. -Strengthening: 2# weight-protraction, flexion, horizontal abduction, er, abduction, x15 -X to V arms, 2#, x15 -Goal post arms, 2#, x15 -Proximal shoulder strengthening: supine, 2#-paddles, criss cross, circles each direction, x12 -  ABC writing: 2# weight-shoulder at 90 degrees flexion -PNF strengthening: red band, chest pulls, overhead pulls, er pulls, PNF up, PNF down, x15 -functional reaching: 2# wrist weight, cones from counter to 1st shelf to 2nd shelf in flexion, taking down in abduction  05/26/24 -manual therapy: myofascial release and trigger point applied to biceps, trapezius, and scapular region in order to reduce pain and fascial restrictions, as well as improve ROM. -Strengthening: 2# weight-protraction, flexion, horizontal abduction, er, abduction, 10 reps -Proximal shoulder strengthening: supine, 2#-paddles, criss cross, circles each direction, 10 reps -Therapy ball strengthening: standing, green ball-chest press, flexion, overhead press, circles each direction, PNF patterns, 10 reps -ABC writing: 2# weight-shoulder at 90 degrees flexion -X to V arms: 2# weight, 10 reps, 2 breaks -Arms on fire: 4 positions, 15 holds, 2' total with 2 rest breaks -Scapular theraband: green band-row, extension, retraction, 10 reps -Overhead lacing: seated with 2# wrist weight, lacing from top down then reversing -UBE: Level 2, 3' reverse, pace: 10.5    PATIENT EDUCATION: Education details: reviewed HEP Person educated: Patient Education method: Explanation, Demonstration, and Handouts Education comprehension: verbalized understanding and returned demonstration  HOME EXERCISE PROGRAM: 11/25: A/ROM and Isometrics 12/5: theraband strengthening-red 12/19: theraband loop exercises    GOALS: Goals reviewed with patient? Yes   SHORT TERM GOALS: Target date:  06/11/24  Pt will be provided with and educated on HEP to improve mobility in RUE required for use during ADL completion.   Goal status: MET  LONG TERM GOALS: Target date: 06/11/24  Pt will decrease pain in RUE to 3/10 or less to improve ability to sleep for 2+ consecutive hours without waking due to pain.   Goal status: MET  2.  Pt will decrease RUE fascial restrictions to min amounts or less to improve mobility required for functional reaching tasks.   Goal status: MET  3.  Pt will increase RUE A/ROM by 10 degrees to improve ability to use RUE when reaching overhead or behind back during dressing and bathing tasks.   Goal status: MET  4.  Pt will increase RUE strength to 5/5 or greater to improve ability to use RUE when lifting or carrying items during meal preparation/housework/yardwork tasks.   Goal status: MET  5.  Pt will return to highest level of function using RUE as dominant during functional task completion.   Goal status: MET   ASSESSMENT:  CLINICAL IMPRESSION: This session pt reports no pain and she completed reassessment. She demonstrated full ROM and 5/5 strength with no pain in all motions throughout session. Pt has met all OT goals and has no further skilled OT needs and will be discharged from Outpatient OT.    PERFORMANCE DEFICITS: in functional skills including in functional skills including ADLs, IADLs, coordination, tone, ROM, strength, pain, fascial restrictions, muscle spasms, and UE functional use.   PLAN:  OT FREQUENCY: 2x/week  OT DURATION: 4 weeks  PLANNED INTERVENTIONS: 97168 OT Re-evaluation, 97535 self care/ADL training, 02889 therapeutic exercise, 97530 therapeutic activity, 97112 neuromuscular re-education, 97140 manual therapy, 97035 ultrasound, 97010 moist heat, 97032 electrical stimulation (manual), passive range of motion, functional mobility training, energy conservation, coping strategies training, patient/family education, and DME  and/or AE instructions  RECOMMENDED OTHER SERVICES: Orthopedic MD  CONSULTED AND AGREED WITH PLAN OF CARE: Patient  PLAN FOR NEXT SESSION: Discharged   Valentin Nightingale, OTR/L [phone] 06/08/2024, 11:24 AM   "

## 2024-06-15 ENCOUNTER — Ambulatory Visit (HOSPITAL_COMMUNITY): Admitting: Occupational Therapy

## 2024-06-18 ENCOUNTER — Ambulatory Visit (HOSPITAL_COMMUNITY): Admitting: Occupational Therapy
